# Patient Record
Sex: Female | Born: 1945 | Race: White | Hispanic: No | Marital: Married | Smoking: Former smoker
Health system: Southern US, Community
[De-identification: ages and names within clinical notes are randomized; demographics above are authoritative.]

## PROBLEM LIST (undated history)

## (undated) DIAGNOSIS — R7301 Impaired fasting glucose: Secondary | ICD-10-CM

## (undated) DIAGNOSIS — E785 Hyperlipidemia, unspecified: Secondary | ICD-10-CM

## (undated) DIAGNOSIS — I10 Essential (primary) hypertension: Secondary | ICD-10-CM

## (undated) DIAGNOSIS — M8580 Other specified disorders of bone density and structure, unspecified site: Secondary | ICD-10-CM

## (undated) DIAGNOSIS — D7589 Other specified diseases of blood and blood-forming organs: Secondary | ICD-10-CM

## (undated) HISTORY — DX: Other specified disorders of bone density and structure, unspecified site: M85.80

## (undated) HISTORY — DX: Essential (primary) hypertension: I10

## (undated) HISTORY — PX: APPENDECTOMY: SHX54

## (undated) HISTORY — DX: Impaired fasting glucose: R73.01

## (undated) HISTORY — PX: BREAST BIOPSY: SHX20

## (undated) HISTORY — DX: Other specified diseases of blood and blood-forming organs: D75.89

## (undated) HISTORY — DX: Hyperlipidemia, unspecified: E78.5

---
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
## 1975-10-08 HISTORY — PX: APPENDECTOMY: SHX54

## 1998-10-07 HISTORY — PX: BUNIONECTOMY: SHX129

## 2012-10-07 HISTORY — PX: COLONOSCOPY: SHX174

## 2014-03-07 HISTORY — PX: BREAST BIOPSY: SHX20

## 2015-04-12 LAB — HM DEXA SCAN

## 2015-12-20 DIAGNOSIS — I10 Essential (primary) hypertension: Secondary | ICD-10-CM | POA: Diagnosis not present

## 2016-01-17 DIAGNOSIS — H2513 Age-related nuclear cataract, bilateral: Secondary | ICD-10-CM | POA: Diagnosis not present

## 2016-02-08 DIAGNOSIS — I10 Essential (primary) hypertension: Secondary | ICD-10-CM | POA: Diagnosis not present

## 2016-02-08 DIAGNOSIS — Z1389 Encounter for screening for other disorder: Secondary | ICD-10-CM | POA: Diagnosis not present

## 2016-02-08 DIAGNOSIS — R7301 Impaired fasting glucose: Secondary | ICD-10-CM | POA: Diagnosis not present

## 2016-02-08 DIAGNOSIS — Z0001 Encounter for general adult medical examination with abnormal findings: Secondary | ICD-10-CM | POA: Diagnosis not present

## 2016-02-26 DIAGNOSIS — L562 Photocontact dermatitis [berloque dermatitis]: Secondary | ICD-10-CM | POA: Diagnosis not present

## 2016-03-01 DIAGNOSIS — Z1231 Encounter for screening mammogram for malignant neoplasm of breast: Secondary | ICD-10-CM | POA: Diagnosis not present

## 2016-03-01 DIAGNOSIS — Z9889 Other specified postprocedural states: Secondary | ICD-10-CM | POA: Diagnosis not present

## 2016-03-01 DIAGNOSIS — R921 Mammographic calcification found on diagnostic imaging of breast: Secondary | ICD-10-CM | POA: Diagnosis not present

## 2016-04-02 DIAGNOSIS — E785 Hyperlipidemia, unspecified: Secondary | ICD-10-CM | POA: Diagnosis not present

## 2016-04-02 DIAGNOSIS — L568 Other specified acute skin changes due to ultraviolet radiation: Secondary | ICD-10-CM | POA: Diagnosis not present

## 2016-04-02 DIAGNOSIS — R7301 Impaired fasting glucose: Secondary | ICD-10-CM | POA: Diagnosis not present

## 2016-04-02 DIAGNOSIS — Z1211 Encounter for screening for malignant neoplasm of colon: Secondary | ICD-10-CM | POA: Diagnosis not present

## 2016-04-02 DIAGNOSIS — D7589 Other specified diseases of blood and blood-forming organs: Secondary | ICD-10-CM

## 2016-04-02 DIAGNOSIS — Z1231 Encounter for screening mammogram for malignant neoplasm of breast: Secondary | ICD-10-CM | POA: Diagnosis not present

## 2016-04-02 DIAGNOSIS — I10 Essential (primary) hypertension: Secondary | ICD-10-CM | POA: Diagnosis not present

## 2016-04-02 DIAGNOSIS — M8580 Other specified disorders of bone density and structure, unspecified site: Secondary | ICD-10-CM

## 2016-04-02 DIAGNOSIS — Z23 Encounter for immunization: Secondary | ICD-10-CM | POA: Diagnosis not present

## 2016-04-02 HISTORY — DX: Other specified disorders of bone density and structure, unspecified site: M85.80

## 2016-04-02 HISTORY — DX: Other specified diseases of blood and blood-forming organs: D75.89

## 2016-05-09 DIAGNOSIS — R21 Rash and other nonspecific skin eruption: Secondary | ICD-10-CM | POA: Diagnosis not present

## 2016-06-26 DIAGNOSIS — L508 Other urticaria: Secondary | ICD-10-CM | POA: Diagnosis not present

## 2016-09-04 DIAGNOSIS — R109 Unspecified abdominal pain: Secondary | ICD-10-CM | POA: Diagnosis not present

## 2016-10-01 DIAGNOSIS — I10 Essential (primary) hypertension: Secondary | ICD-10-CM | POA: Diagnosis not present

## 2016-10-01 DIAGNOSIS — E785 Hyperlipidemia, unspecified: Secondary | ICD-10-CM | POA: Diagnosis not present

## 2016-10-01 DIAGNOSIS — R7301 Impaired fasting glucose: Secondary | ICD-10-CM | POA: Diagnosis not present

## 2016-10-01 DIAGNOSIS — D7589 Other specified diseases of blood and blood-forming organs: Secondary | ICD-10-CM | POA: Diagnosis not present

## 2016-10-08 DIAGNOSIS — R7301 Impaired fasting glucose: Secondary | ICD-10-CM | POA: Diagnosis not present

## 2016-10-08 DIAGNOSIS — D7589 Other specified diseases of blood and blood-forming organs: Secondary | ICD-10-CM | POA: Diagnosis not present

## 2016-10-08 DIAGNOSIS — I10 Essential (primary) hypertension: Secondary | ICD-10-CM | POA: Diagnosis not present

## 2016-10-08 DIAGNOSIS — E785 Hyperlipidemia, unspecified: Secondary | ICD-10-CM | POA: Diagnosis not present

## 2016-10-08 DIAGNOSIS — M8580 Other specified disorders of bone density and structure, unspecified site: Secondary | ICD-10-CM | POA: Diagnosis not present

## 2016-10-21 DIAGNOSIS — H2513 Age-related nuclear cataract, bilateral: Secondary | ICD-10-CM | POA: Diagnosis not present

## 2016-10-21 DIAGNOSIS — H524 Presbyopia: Secondary | ICD-10-CM | POA: Diagnosis not present

## 2017-01-15 DIAGNOSIS — L508 Other urticaria: Secondary | ICD-10-CM | POA: Diagnosis not present

## 2017-01-15 DIAGNOSIS — L810 Postinflammatory hyperpigmentation: Secondary | ICD-10-CM | POA: Diagnosis not present

## 2017-02-10 DIAGNOSIS — D690 Allergic purpura: Secondary | ICD-10-CM | POA: Diagnosis not present

## 2017-02-10 DIAGNOSIS — L308 Other specified dermatitis: Secondary | ICD-10-CM | POA: Diagnosis not present

## 2017-04-01 DIAGNOSIS — L820 Inflamed seborrheic keratosis: Secondary | ICD-10-CM | POA: Diagnosis not present

## 2017-04-03 DIAGNOSIS — Z1231 Encounter for screening mammogram for malignant neoplasm of breast: Secondary | ICD-10-CM | POA: Diagnosis not present

## 2017-04-03 LAB — HM MAMMOGRAPHY: HM Mammogram: NORMAL (ref 0–4)

## 2017-04-15 DIAGNOSIS — D7589 Other specified diseases of blood and blood-forming organs: Secondary | ICD-10-CM | POA: Diagnosis not present

## 2017-04-15 DIAGNOSIS — E785 Hyperlipidemia, unspecified: Secondary | ICD-10-CM | POA: Diagnosis not present

## 2017-04-15 DIAGNOSIS — R7301 Impaired fasting glucose: Secondary | ICD-10-CM | POA: Diagnosis not present

## 2017-04-15 DIAGNOSIS — I10 Essential (primary) hypertension: Secondary | ICD-10-CM | POA: Diagnosis not present

## 2017-04-15 LAB — CBC AND DIFFERENTIAL
HCT: 38 (ref 36–46)
HEMOGLOBIN: 12.5 (ref 12.0–16.0)
Platelets: 183 (ref 150–399)
WBC: 4

## 2017-04-15 LAB — HEPATIC FUNCTION PANEL
ALT: 22 (ref 7–35)
AST: 32 (ref 13–35)
Alkaline Phosphatase: 67 (ref 25–125)
BILIRUBIN, TOTAL: 0.3

## 2017-04-15 LAB — LIPID PANEL
CHOLESTEROL: 206 — AB (ref 0–200)
HDL: 83 — AB (ref 35–70)
LDL Cholesterol: 100
TRIGLYCERIDES: 116 (ref 40–160)

## 2017-04-15 LAB — BASIC METABOLIC PANEL
BUN: 11 (ref 4–21)
Creatinine: 0.6 (ref 0.5–1.1)
Glucose: 76
POTASSIUM: 4 (ref 3.4–5.3)
Sodium: 143 (ref 137–147)

## 2017-04-15 LAB — TSH: TSH: 4.9 (ref 0.41–5.90)

## 2017-04-22 DIAGNOSIS — D7589 Other specified diseases of blood and blood-forming organs: Secondary | ICD-10-CM | POA: Diagnosis not present

## 2017-04-22 DIAGNOSIS — I10 Essential (primary) hypertension: Secondary | ICD-10-CM | POA: Diagnosis not present

## 2017-04-22 DIAGNOSIS — M8580 Other specified disorders of bone density and structure, unspecified site: Secondary | ICD-10-CM | POA: Diagnosis not present

## 2017-04-22 DIAGNOSIS — R7301 Impaired fasting glucose: Secondary | ICD-10-CM | POA: Diagnosis not present

## 2017-04-22 DIAGNOSIS — E785 Hyperlipidemia, unspecified: Secondary | ICD-10-CM | POA: Diagnosis not present

## 2017-04-29 DIAGNOSIS — R921 Mammographic calcification found on diagnostic imaging of breast: Secondary | ICD-10-CM | POA: Diagnosis not present

## 2017-04-29 DIAGNOSIS — R928 Other abnormal and inconclusive findings on diagnostic imaging of breast: Secondary | ICD-10-CM | POA: Diagnosis not present

## 2017-06-04 DIAGNOSIS — L820 Inflamed seborrheic keratosis: Secondary | ICD-10-CM | POA: Diagnosis not present

## 2017-08-05 DIAGNOSIS — L209 Atopic dermatitis, unspecified: Secondary | ICD-10-CM | POA: Diagnosis not present

## 2017-08-05 DIAGNOSIS — E785 Hyperlipidemia, unspecified: Secondary | ICD-10-CM | POA: Diagnosis not present

## 2017-08-05 DIAGNOSIS — D692 Other nonthrombocytopenic purpura: Secondary | ICD-10-CM | POA: Diagnosis not present

## 2017-08-05 DIAGNOSIS — I10 Essential (primary) hypertension: Secondary | ICD-10-CM | POA: Diagnosis not present

## 2017-08-05 DIAGNOSIS — R109 Unspecified abdominal pain: Secondary | ICD-10-CM | POA: Diagnosis not present

## 2017-09-18 ENCOUNTER — Telehealth: Payer: Self-pay | Admitting: General Practice

## 2017-09-18 NOTE — Telephone Encounter (Signed)
Copied from [place] [phone]. Topic: Quick Communication - See Telephone Encounter >> Sep 18, 2017 10:52 AM Oneta Rack wrote: CRM for notification. See Telephone encounter for:   09/18/17.Relation to pt: spouse / Mr. Laurie Decker  Call back number: [phone]   Reason for call:  Patient would like to establish with Dr. Garret Reddish due to location being convenient, patient has AARP and Medicare. Patient would like spouse to establish preferable on the same day. Please advise if 10/24/16 at 3:30pm is appropriate.

## 2017-09-18 NOTE — Telephone Encounter (Signed)
Only accepting referrals from family/friends of current patient if medicare- if they are not in this group- can see one of our other excellent providers

## 2017-09-18 NOTE — Telephone Encounter (Signed)
Dr. Yong Channel,  Please advise if you approve of this new patient appointment.

## 2017-09-19 NOTE — Telephone Encounter (Signed)
Informed patient of message below and patient stated [employer] members referred MD, please advise

## 2017-09-26 NOTE — Telephone Encounter (Signed)
Can see one of our other clinician's since not clearly close friends/family of one of my existing patients.

## 2017-09-26 NOTE — Telephone Encounter (Signed)
Please advise 

## 2017-10-17 ENCOUNTER — Encounter: Payer: Self-pay | Admitting: Internal Medicine

## 2017-10-17 ENCOUNTER — Telehealth: Payer: Self-pay

## 2017-10-17 ENCOUNTER — Ambulatory Visit (INDEPENDENT_AMBULATORY_CARE_PROVIDER_SITE_OTHER): Payer: Medicare Other | Admitting: Internal Medicine

## 2017-10-17 VITALS — BP 122/70 | HR 78 | Temp 97.8°F | Resp 18 | Ht 63.0 in | Wt 143.8 lb

## 2017-10-17 DIAGNOSIS — M7918 Myalgia, other site: Secondary | ICD-10-CM | POA: Diagnosis not present

## 2017-10-17 DIAGNOSIS — I10 Essential (primary) hypertension: Secondary | ICD-10-CM | POA: Diagnosis not present

## 2017-10-17 DIAGNOSIS — R921 Mammographic calcification found on diagnostic imaging of breast: Secondary | ICD-10-CM | POA: Diagnosis not present

## 2017-10-17 DIAGNOSIS — R21 Rash and other nonspecific skin eruption: Secondary | ICD-10-CM

## 2017-10-17 NOTE — Telephone Encounter (Signed)
Patient was seen in the office today as a new patient for Dr. Eulas Post. Patient filled out the medical release paper but did not fill out who her previous pcp was. I tried to get in contact with the patient but her line was busy so I was unable to leave the patient a message to give me a call back. Will try to contact again before I leave for the day.

## 2017-10-17 NOTE — Progress Notes (Signed)
Patient ID: Laurie Decker, female   DOB: 10-25-1945, 72 y.o.   MRN: [id number]   Location:  [facility] OFFICE  Provider: DR Arletha Grippe  Code Status: FULL CODE Goals of Care: No flowsheet data found.   Chief Complaint  Patient presents with  . New Patient (Initial Visit)    New Patient to Establish with Dr. [facility]    HPI: Patient is a 72 y.o. female seen today as a new pt. Previous PCP in [place]. She is unsure of last CPE. 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 She states she had small pox vaccine x 13 and never developed immunity.  HTN - stable on lisinopril. ECG done in Nov 2018.  Right flank pain - intermittent. Stable on prn aleve. She was told it was due to "musculoskeletal" problem. She reports right rib fx prior to onset of pain.  Rash - nonspecific. Began while living in [place], [place] in 2012. She has seen x3 dermatologists and several tests have been run, including biopsy. No formal dx and w/u neg. Burning sensation and occurs on UE, ACW, and occasionally face. She has been tx with prn prednisone.   She takes several OTC supplements   Past Medical History:  Diagnosis Date  . High blood pressure     Past Surgical History:  Procedure Laterality Date  . APPENDECTOMY    . APPENDECTOMY  1977  . BUNIONECTOMY  2000     reports that she has been smoking.  She has been smoking about 3.00 packs per day. she has never used smokeless tobacco. She reports that she drinks alcohol. She reports that she does not use drugs. Social History   Socioeconomic History  . Marital status: Married    Spouse name: Not on file  . Number of children: Not on file  . Years of education: Not on file  . Highest education level: Not on file  Social Needs  . Financial resource strain: Not on file  . Food insecurity - worry: Not on file  . Food insecurity - inability: Not on file  . Transportation needs - medical: Not on file  . Transportation  needs - non-medical: Not on file  Occupational History  . Not on file  Tobacco Use  . Smoking status: Current Every Day Smoker    Packs/day: 3.00  . Smokeless tobacco: Never Used  Substance and Sexual Activity  . Alcohol use: Yes    Comment: 7 drinks per week   . Drug use: No  . Sexual activity: Not on file  Other Topics Concern  . Not on file  Social History Narrative   Diet: N/A      Caffeine: 1 cup of coffee daily      Married, if yes what year: 2014      Do you live in a house, apartment, assisted living, condo, trailer, ect: House, 1 stories, 2 persons       Pets: 1 dog       Current/Past profession: Nursing School       Exercise: Yes, daily         Living Will:  Yes   DNR: Yes   POA/HPOA: Yes       Functional Status:   Do you have difficulty bathing or dressing yourself? No   Do you have difficulty preparing food or eating? No   Do you have difficulty managing your medications? No   Do you have difficulty  managing your finances? No   Do you have difficulty affording your medications? No    Family History  Problem Relation Age of Onset  . Alcoholism Father   . Colon cancer Father     Allergies  Allergen Reactions  . Latex     Outpatient Encounter Medications as of 10/17/2017  Medication Sig  . Flaxseed, Linseed, (FLAXSEED OIL) 1000 MG CAPS Take by mouth daily.  Marland Kitchen lisinopril (PRINIVIL,ZESTRIL) 20 MG tablet Take 20 mg by mouth daily.  Marland Kitchen Lysine HCl 1000 MG TABS Take by mouth daily.  . Multiple Vitamins-Minerals (CENTRUM ADULTS PO) Take by mouth daily.  . naproxen sodium (ALEVE) 220 MG tablet Take 220 mg by mouth as needed.  . valACYclovir (VALTREX) 1000 MG tablet Take 1,000 mg by mouth as needed. Take 2 tablets   No facility-administered encounter medications on file as of 10/17/2017.     Review of Systems:  Review of Systems  Musculoskeletal: Positive for myalgias.  Hematological: Bruises/bleeds easily.  All other systems reviewed and are  negative.   Health Maintenance  Topic Date Due  . Hepatitis C Screening  10/13/1945  . MAMMOGRAM  09/23/1996  . COLONOSCOPY  09/23/1996  . DEXA SCAN  09/24/2011  . PNA vac Low Risk Adult (1 of 2 - PCV13) 09/24/2011  . TETANUS/TDAP  02/27/2014  . INFLUENZA VACCINE  05/07/2017    Physical Exam: Vitals:   10/17/17 1051  BP: 122/70  Pulse: 78  Resp: 18  Temp: 97.8 F (36.6 C)  TempSrc: Oral  SpO2: 97%  Weight: 143 lb 12.8 oz (65.2 kg)  Height: 5\' 3"  (1.6 m)   Body mass index is 25.47 kg/m. Physical Exam  Constitutional: She is oriented to person, place, and time. She appears well-developed and well-nourished.  HENT:  Mouth/Throat: Oropharynx is clear and moist. No oropharyngeal exudate.  MMM; no oral thrush  Eyes: Pupils are equal, round, and reactive to light. No scleral icterus.  Neck: Neck supple. Carotid bruit is not present. No tracheal deviation present. No thyromegaly present.  Cardiovascular: Normal rate, regular rhythm, normal heart sounds and intact distal pulses. Exam reveals no gallop and no friction rub.  No murmur heard. No LE edema b/l. no calf TTP.   Pulmonary/Chest: Effort normal and breath sounds normal. No stridor. No respiratory distress. She has no wheezes. She has no rales.  Abdominal: Soft. Normal appearance and bowel sounds are normal. She exhibits no distension and no mass. There is no hepatomegaly. There is no tenderness. There is no rigidity, no rebound and no guarding. No hernia.  Lymphadenopathy:    She has no cervical adenopathy.  Neurological: She is alert and oriented to person, place, and time.  Skin: Skin is warm and dry. No rash noted.  Psychiatric: She has a normal mood and affect. Her behavior is normal. Judgment and thought content normal.    Labs reviewed: Basic Metabolic Panel: No results for input(s): NA, K, CL, CO2, GLUCOSE, BUN, CREATININE, CALCIUM, MG, PHOS, TSH in the last 8760 hours. Liver Function Tests: No results for  input(s): AST, ALT, ALKPHOS, BILITOT, PROT, ALBUMIN in the last 8760 hours. No results for input(s): LIPASE, AMYLASE in the last 8760 hours. No results for input(s): AMMONIA in the last 8760 hours. CBC: No results for input(s): WBC, NEUTROABS, HGB, HCT, MCV, PLT in the last 8760 hours. Lipid Panel: No results for input(s): CHOL, HDL, LDLCALC, TRIG, CHOLHDL, LDLDIRECT in the last 8760 hours. No results found for: HGBA1C  Procedures since last  visit: No results found.  Assessment/Plan     ICD-10-CM   1. Essential hypertension I10   2. Musculoskeletal pain M79.18    right flank, intermittent  3. Rash and nonspecific skin eruption R21    nonspecific; burning sensation  4. Breast calcifications R92.1 MM Digital Diagnostic Unilat R   right; s/p biopsy in July 2018   Continue current medications as ordered  Get old records  Follow up in 3 mos for AWV/EV. Fasting labs prior to appt (CMP, lipid panel, tsh, cbc w diff, ua)   Sherryn Pollino S. Perlie Gold  [facility] [address] [place], [place] [address] [phone] Cell (Monday-Friday 8 AM - 5 PM) [phone] After 5 PM and follow prompts

## 2017-10-17 NOTE — Patient Instructions (Signed)
Continue current medications as ordered  Get old records  Follow up in 3 mos for AWV/EV. Fasting labs prior to appt

## 2017-10-22 ENCOUNTER — Telehealth: Payer: Self-pay

## 2017-10-22 NOTE — Telephone Encounter (Signed)
Patient called to give information on previous dermatologist.   Dr. Stacie Acres  [facility] [place] [address]  Phone: [phone]

## 2017-10-24 ENCOUNTER — Ambulatory Visit: Payer: Self-pay | Admitting: Family Medicine

## 2017-10-24 ENCOUNTER — Ambulatory Visit: Payer: Self-pay | Admitting: Sports Medicine

## 2018-01-01 ENCOUNTER — Other Ambulatory Visit: Payer: Self-pay | Admitting: Internal Medicine

## 2018-01-12 ENCOUNTER — Other Ambulatory Visit: Payer: Self-pay | Admitting: Internal Medicine

## 2018-01-12 ENCOUNTER — Ambulatory Visit
Admission: RE | Admit: 2018-01-12 | Discharge: 2018-01-12 | Disposition: A | Discharge status: Home / Self Care | Payer: Medicare Other | Source: Ambulatory Visit | Attending: Internal Medicine | Admitting: Internal Medicine

## 2018-01-12 DIAGNOSIS — R921 Mammographic calcification found on diagnostic imaging of breast: Secondary | ICD-10-CM

## 2018-01-13 NOTE — Addendum Note (Signed)
Addended by: Logan Bores on: 01/13/2018 04:52 PM   Modules accepted: Orders

## 2018-01-16 ENCOUNTER — Other Ambulatory Visit: Payer: Medicare Other

## 2018-01-16 ENCOUNTER — Ambulatory Visit (INDEPENDENT_AMBULATORY_CARE_PROVIDER_SITE_OTHER): Payer: Medicare Other

## 2018-01-16 VITALS — BP 120/70 | HR 85 | Temp 98.0°F | Ht 63.0 in | Wt 145.0 lb

## 2018-01-16 DIAGNOSIS — R921 Mammographic calcification found on diagnostic imaging of breast: Secondary | ICD-10-CM

## 2018-01-16 DIAGNOSIS — R21 Rash and other nonspecific skin eruption: Secondary | ICD-10-CM | POA: Diagnosis not present

## 2018-01-16 DIAGNOSIS — M7918 Myalgia, other site: Secondary | ICD-10-CM | POA: Diagnosis not present

## 2018-01-16 DIAGNOSIS — I10 Essential (primary) hypertension: Secondary | ICD-10-CM

## 2018-01-16 DIAGNOSIS — Z0000 Encounter for general adult medical examination without abnormal findings: Secondary | ICD-10-CM

## 2018-01-16 LAB — COMPLETE METABOLIC PANEL WITH GFR
AG Ratio: 1.9 (calc) (ref 1.0–2.5)
ALKALINE PHOSPHATASE (APISO): 65 U/L (ref 33–130)
ALT: 16 U/L (ref 6–29)
AST: 22 U/L (ref 10–35)
Albumin: 4.4 g/dL (ref 3.6–5.1)
BUN: 18 mg/dL (ref 7–25)
CO2: 31 mmol/L (ref 20–32)
CREATININE: 0.69 mg/dL (ref 0.60–0.93)
Calcium: 9.9 mg/dL (ref 8.6–10.4)
Chloride: 99 mmol/L (ref 98–110)
GFR, Est African American: 102 mL/min/{1.73_m2} (ref >=60)
GFR, Est Non African American: 88 mL/min/{1.73_m2} (ref >=60)
GLUCOSE: 83 mg/dL (ref 65–99)
Globulin: 2.3 g/dL (calc) (ref 1.9–3.7)
Potassium: 4.5 mmol/L (ref 3.5–5.3)
SODIUM: 138 mmol/L (ref 135–146)
Total Bilirubin: 0.6 mg/dL (ref 0.2–1.2)
Total Protein: 6.7 g/dL (ref 6.1–8.1)

## 2018-01-16 LAB — CBC WITH DIFFERENTIAL/PLATELET
BASOS PCT: 0.9 %
Basophils Absolute: 41 cells/uL (ref 0–200)
EOS ABS: 437 {cells}/uL (ref 15–500)
Eosinophils Relative: 9.5 %
HCT: 38.9 % (ref 35.0–45.0)
HEMOGLOBIN: 13.6 g/dL (ref 11.7–15.5)
Lymphs Abs: 1564 cells/uL (ref 850–3900)
MCH: 33.6 pg — AB (ref 27.0–33.0)
MCHC: 35 g/dL (ref 32.0–36.0)
MCV: 96 fL (ref 80.0–100.0)
MPV: 10.7 fL (ref 7.5–12.5)
Monocytes Relative: 11.9 %
Neutro Abs: 2010 cells/uL (ref 1500–7800)
Neutrophils Relative %: 43.7 %
PLATELETS: 175 10*3/uL (ref 140–400)
RBC: 4.05 10*6/uL (ref 3.80–5.10)
RDW: 12.2 % (ref 11.0–15.0)
TOTAL LYMPHOCYTE: 34 %
WBC: 4.6 10*3/uL (ref 3.8–10.8)
WBCMIX: 547 {cells}/uL (ref 200–950)

## 2018-01-16 LAB — LIPID PANEL
CHOLESTEROL: 228 mg/dL — AB (ref <200)
HDL: 76 mg/dL (ref >50)
LDL CHOLESTEROL (CALC): 127 mg/dL — AB
Non-HDL Cholesterol (Calc): 152 mg/dL (calc) — ABNORMAL HIGH (ref <130)
Total CHOL/HDL Ratio: 3 (calc) (ref <5.0)
Triglycerides: 136 mg/dL (ref <150)

## 2018-01-16 LAB — URINALYSIS, ROUTINE W REFLEX MICROSCOPIC
Bilirubin Urine: NEGATIVE
Glucose, UA: NEGATIVE
Hgb urine dipstick: NEGATIVE
KETONES UR: NEGATIVE
LEUKOCYTES UA: NEGATIVE
NITRITE: NEGATIVE
PROTEIN: NEGATIVE
Specific Gravity, Urine: 1.022 (ref 1.001–1.03)
pH: 6.5 (ref 5.0–8.0)

## 2018-01-16 LAB — TSH: TSH: 2.8 mIU/L (ref 0.40–4.50)

## 2018-01-16 NOTE — Patient Instructions (Signed)
[name] , Thank you for taking time to come for your Medicare Wellness Visit. I appreciate your ongoing commitment to your health goals. Please review the following plan we discussed and let me know if I can assist you in the future.   Screening recommendations/referrals: Colonoscopy up to date, due 03/11/2022 Mammogram up to date, due 03/27/2018 Bone Density up to date, due 11/18/2019 Recommended yearly ophthalmology/optometry visit for glaucoma screening and checkup Recommended yearly dental visit for hygiene and checkup  Vaccinations: Influenza vaccine due, declined Pneumococcal vaccine due, declined Tdap vaccine up to date, due 07/15/2018 Shingles vaccine due, declined    Advanced directives: Please bring [name] a copy to scan into your chart  Conditions/risks identified: none  Next appointment: Dr. Eulas Post 01/20/2018 @ 9:45am             Tyson Dense, RN 01/22/2019 @ 10am   Preventive Care 65 Years and Older, Female Preventive care refers to lifestyle choices and visits with your health care provider that can promote health and wellness. What does preventive care include?  A yearly physical exam. This is also called an annual well check.  Dental exams once or twice a year.  Routine eye exams. Ask your health care provider how often you should have your eyes checked.  Personal lifestyle choices, including:  Daily care of your teeth and gums.  Regular physical activity.  Eating a healthy diet.  Avoiding tobacco and drug use.  Limiting alcohol use.  Practicing safe sex.  Taking low-dose aspirin every day.  Taking vitamin and mineral supplements as recommended by your health care provider. What happens during an annual well check? The services and screenings done by your health care provider during your annual well check will depend on your age, overall health, lifestyle risk factors, and family history of disease. Counseling  Your health care provider may ask you  questions about your:  Alcohol use.  Tobacco use.  Drug use.  Emotional well-being.  Home and relationship well-being.  Sexual activity.  Eating habits.  History of falls.  Memory and ability to understand (cognition).  Work and work Statistician.  Reproductive health. Screening  You may have the following tests or measurements:  Height, weight, and BMI.  Blood pressure.  Lipid and cholesterol levels. These may be checked every 5 years, or more frequently if you are over 58 years old.  Skin check.  Lung cancer screening. You may have this screening every year starting at age 28 if you have a 30-pack-year history of smoking and currently smoke or have quit within the past 15 years.  Fecal occult blood test (FOBT) of the stool. You may have this test every year starting at age 40.  Flexible sigmoidoscopy or colonoscopy. You may have a sigmoidoscopy every 5 years or a colonoscopy every 10 years starting at age 20.  Hepatitis C blood test.  Hepatitis B blood test.  Sexually transmitted disease (STD) testing.  Diabetes screening. This is done by checking your blood sugar (glucose) after you have not eaten for a while (fasting). You may have this done every 1-3 years.  Bone density scan. This is done to screen for osteoporosis. You may have this done starting at age 56.  Mammogram. This may be done every 1-2 years. Talk to your health care provider about how often you should have regular mammograms. Talk with your health care provider about your test results, treatment options, and if necessary, the need for more tests. Vaccines  Your health care  provider may recommend certain vaccines, such as:  Influenza vaccine. This is recommended every year.  Tetanus, diphtheria, and acellular pertussis (Tdap, Td) vaccine. You may need a Td booster every 10 years.  Zoster vaccine. You may need this after age 57.  Pneumococcal 13-valent conjugate (PCV13) vaccine. One dose is  recommended after age 40.  Pneumococcal polysaccharide (PPSV23) vaccine. One dose is recommended after age 37. Talk to your health care provider about which screenings and vaccines you need and how often you need them. This information is not intended to replace advice given to you by your health care provider. Make sure you discuss any questions you have with your health care provider. Document Released: 10/20/2015 Document Revised: 06/12/2016 Document Reviewed: 07/25/2015 Elsevier Interactive Patient Education  2017 [place] Prevention in the Home Falls can cause injuries. They can happen to people of all ages. There are many things you can do to make your home safe and to help prevent falls. What can I do on the outside of my home?  Regularly fix the edges of walkways and driveways and fix any cracks.  Remove anything that might make you trip as you walk through a door, such as a raised step or threshold.  Trim any bushes or trees on the path to your home.  Use bright outdoor lighting.  Clear any walking paths of anything that might make someone trip, such as rocks or tools.  Regularly check to see if handrails are loose or broken. Make sure that both sides of any steps have handrails.  Any raised decks and porches should have guardrails on the edges.  Have any leaves, snow, or ice cleared regularly.  Use sand or salt on walking paths during winter.  Clean up any spills in your garage right away. This includes oil or grease spills. What can I do in the bathroom?  Use night lights.  Install grab bars by the toilet and in the tub and shower. Do not use towel bars as grab bars.  Use non-skid mats or decals in the tub or shower.  If you need to sit down in the shower, use a plastic, non-slip stool.  Keep the floor dry. Clean up any water that spills on the floor as soon as it happens.  Remove soap buildup in the tub or shower regularly.  Attach bath mats  securely with double-sided non-slip rug tape.  Do not have throw rugs and other things on the floor that can make you trip. What can I do in the bedroom?  Use night lights.  Make sure that you have a light by your bed that is easy to reach.  Do not use any sheets or blankets that are too big for your bed. They should not hang down onto the floor.  Have a firm chair that has side arms. You can use this for support while you get dressed.  Do not have throw rugs and other things on the floor that can make you trip. What can I do in the kitchen?  Clean up any spills right away.  Avoid walking on wet floors.  Keep items that you use a lot in easy-to-reach places.  If you need to reach something above you, use a strong step stool that has a grab bar.  Keep electrical cords out of the way.  Do not use floor polish or wax that makes floors slippery. If you must use wax, use non-skid floor wax.  Do not have throw  rugs and other things on the floor that can make you trip. What can I do with my stairs?  Do not leave any items on the stairs.  Make sure that there are handrails on both sides of the stairs and use them. Fix handrails that are broken or loose. Make sure that handrails are as long as the stairways.  Check any carpeting to make sure that it is firmly attached to the stairs. Fix any carpet that is loose or worn.  Avoid having throw rugs at the top or bottom of the stairs. If you do have throw rugs, attach them to the floor with carpet tape.  Make sure that you have a light switch at the top of the stairs and the bottom of the stairs. If you do not have them, ask someone to add them for you. What else can I do to help prevent falls?  Wear shoes that:  Do not have high heels.  Have rubber bottoms.  Are comfortable and fit you well.  Are closed at the toe. Do not wear sandals.  If you use a stepladder:  Make sure that it is fully opened. Do not climb a closed  stepladder.  Make sure that both sides of the stepladder are locked into place.  Ask someone to hold it for you, if possible.  Clearly mark and make sure that you can see:  Any grab bars or handrails.  First and last steps.  Where the edge of each step is.  Use tools that help you move around (mobility aids) if they are needed. These include:  Canes.  Walkers.  Scooters.  Crutches.  Turn on the lights when you go into a dark area. Replace any light bulbs as soon as they burn out.  Set up your furniture so you have a clear path. Avoid moving your furniture around.  If any of your floors are uneven, fix them.  If there are any pets around you, be aware of where they are.  Review your medicines with your doctor. Some medicines can make you feel dizzy. This can increase your chance of falling. Ask your doctor what other things that you can do to help prevent falls. This information is not intended to replace advice given to you by your health care provider. Make sure you discuss any questions you have with your health care provider. Document Released: 07/20/2009 Document Revised: 02/29/2016 Document Reviewed: 10/28/2014 Elsevier Interactive Patient Education  2017 [employer].

## 2018-01-16 NOTE — Progress Notes (Signed)
Subjective:   Laurie Decker is a 72 y.o. female who presents for Medicare Annual (Subsequent) preventive examination.  Last AWV-02/2016    Objective:     Vitals: BP 120/70 (BP Location: Left Arm, Patient Position: Sitting)   Pulse 85   Temp 98 F (36.7 C) (Oral)   Ht 5\' 3"  (1.6 m)   SpO2 97%   BMI 25.47 kg/m   Body mass index is 25.47 kg/m.  Advanced Directives 01/16/2018  Does Patient Have a Medical Advance Directive? Yes  Type of Paramedic of [place];Living will  Does patient want to make changes to medical advance directive? No - Patient declined  Copy of [place] in Chart? No - copy requested    Tobacco Social History   Tobacco Use  Smoking Status Former Smoker  . 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 High blood pressure   . Hyperlipidemia    Per previous Cardiology Record   . Impaired fasting glucose    Per previous Cardiology records   . Macrocytosis 04/02/2016   Per previous Cardiology records   . Osteopenia 04/02/2016   Per previous Cardiology records    Past Surgical History:  Procedure Laterality Date  . APPENDECTOMY    . APPENDECTOMY  1977  . BREAST BIOPSY  03/07/2014     Benign  . BUNIONECTOMY  2000  . COLONOSCOPY  10/07/2012   Per previous Cardiology records   . TONSILLECTOMY AND ADENOIDECTOMY  10/08/1955   Per previous Cardiology records    Family History  Problem Relation Age of Onset  . Heart disease Mother   . Alcoholism Father   . Colon cancer Father        malignant tumor of colon    Social History   Socioeconomic History  . Marital status: Married    Spouse name: Not on file  . Number of children: Not on file  . Years of education: Not on file  . Highest education level: Not on file  Occupational History  . Not on file  Social Needs  . Financial resource strain: Not hard at all  . Food insecurity:    Worry: Never true    Inability: Never true  . Transportation needs:    Medical: No    Non-medical: No  Tobacco Use  . Smoking status: Former Smoker    Packs/day: 3.00  . Smokeless tobacco: Never Used  . Tobacco comment: Quit in 45s  Substance and Sexual Activity  . Alcohol use: Yes    Alcohol/week: 1.8 oz    Types: 3 Standard drinks or equivalent per week    Comment: 3 drinks a day-vodka  . Drug use: No  . Sexual activity: Not on file  Lifestyle  . Physical activity:    Days per week: 0 days    Minutes per session: 0 min  . Stress: Only a little  Relationships  . Social connections:    Talks on phone: Twice a week    Gets together: Twice a week    Attends religious service: Never    Active member of club or organization: Yes    Attends meetings of clubs or organizations: More than 4 times per year    Relationship status: Married  Other Topics Concern  . Not on file  Social History Narrative   Diet: N/A      Caffeine: 1 cup of coffee daily      Married, if yes what year: 2014      Do you live in a house, apartment, assisted living, condo, trailer, ect: House, 1 stories, 2 persons       Pets: 1 dog       Current/Past profession: Nursing School       Exercise: Yes, daily         Living Will:  Yes   DNR:  Yes   POA/HPOA: Yes       Functional Status:   Do you have difficulty bathing or dressing yourself? No   Do you have difficulty preparing food or eating? No   Do you have difficulty managing your medications? No   Do you have difficulty managing your finances? No   Do you have difficulty affording your medications? No    Outpatient Encounter Medications as of 01/16/2018  Medication Sig  . Flaxseed, Linseed, (FLAXSEED OIL) 1000 MG CAPS Take by mouth daily.  Marland Kitchen lisinopril (PRINIVIL,ZESTRIL) 20 MG tablet TAKE 1 TABLET BY MOUTH DAILY  . Lysine HCl 1000 MG TABS Take by mouth daily.  . Multiple Vitamins-Minerals (CENTRUM ADULTS PO) Take by mouth daily.  . naproxen sodium (ALEVE) 220 MG tablet Take 220 mg by mouth as needed.  . Potassium Gluconate 550 MG TABS Take 550 mg by mouth.  . valACYclovir (VALTREX) 1000 MG tablet Take 1,000 mg by mouth as needed. Take 2 tablets   No facility-administered encounter medications on file as of 01/16/2018.     Activities of Daily Living In your present state of health, do you have any difficulty performing the following activities: 01/16/2018  Hearing? N  Vision? N  Difficulty concentrating or making decisions? N  Walking or climbing stairs? N  Dressing or bathing? N  Doing errands, shopping? N  Preparing Food and eating ? N  Using the Toilet? N  In the past six months, have you accidently leaked urine? N  Do you have problems with loss of bowel control? N  Managing your Medications? N  Managing your Finances? N  Housekeeping or managing your Housekeeping? N  Some recent data might be hidden    Patient Care Team: Gildardo Cranker, DO as PCP - General (Internal Medicine)    Assessment:   This is a routine wellness examination for [place].  Exercise Activities and Dietary recommendations Current Exercise Habits: The patient does not participate in regular exercise at present, Exercise limited by: None identified  Goals    . Patient Stated      Patient will start doing water aerobics       Fall Risk Fall Risk  01/16/2018 10/17/2017  Falls in the past year? Yes No  Number falls in past yr: 1 -  Injury with Fall? Yes -  Comment laceration of face -  Is the patient's home free of loose throw rugs in walkways, pet beds, electrical cords, etc?   yes      Grab bars in the bathroom? yes      Handrails on the stairs?   yes      Adequate lighting?   yes  Timed Get Up and Go performed: 18 seconds, fall risk  Depression Screen PHQ 2/9 Scores 01/16/2018 10/17/2017  PHQ - 2 Score 0 0     Cognitive Function MMSE - Mini Mental State Exam 01/16/2018  Orientation to time 5  Orientation to Place 5  Registration 3  Attention/ Calculation 5  Recall 2  Language- name 2 objects 2  Language- repeat 1  Language- follow 3 step command 3  Language- read & follow direction 1  Write a sentence 1  Copy design 1  Total score 29        Immunization History  Administered Date(s) Administered  . Hepatitis A 04/26/2004, 12/14/2004  . Hepatitis B, adult 10/07/2005  . IPV 07/15/2008  . Td 07/21/1976, 02/03/2004  . Tdap 07/15/2008  . Yellow Fever 02/03/2004    Qualifies for Shingles Vaccine? Yes, educated and declined  Screening Tests Health Maintenance  Topic Date Due  . Hepatitis C Screening  06-08-1946  . PNA vac Low Risk Adult (1 of 2 - PCV13) 09/24/2011  . INFLUENZA VACCINE  05/07/2018  . TETANUS/TDAP  07/15/2018  . MAMMOGRAM  04/04/2019  . COLONOSCOPY  03/11/2022  . DEXA SCAN  Completed    Cancer Screenings: Lung: Low Dose CT Chest recommended if Age 30-80 years, 30 pack-year currently smoking OR have quit w/in 15years. Patient does not qualify. Breast:  Up to date on Mammogram? Yes   Up to date of Bone Density/Dexa? Yes Colorectal: up to date  Additional Screenings: : Hepatitis C Screening: declined     Plan:    I have personally reviewed and addressed the Medicare Annual Wellness questionnaire and have noted the  following in the patient's chart:  A. Medical and social history B. Use of alcohol, tobacco or illicit drugs  C. Current medications and supplements D. Functional ability and status E.  Nutritional status F.  Physical activity G. Advance directives H. List of other physicians I.  Hospitalizations, surgeries, and ER visits in previous 12 months J.  Sh[place] to include hearing, vision, cognitive, depression L. Referrals and appointments - none  In addition, I have reviewed and discussed with patient certain preventive protocols, quality metrics, and best practice recommendations. A written personalized care plan for preventive services as well as general preventive health recommendations were provided to patient.  See attached scanned questionnaire for additional information.   Signed,   Tyson Dense, RN Nurse Health Advisor  Patient Concerns: Loose stools over last month, no pain, cramps or blood in stools

## 2018-01-20 ENCOUNTER — Encounter: Payer: Self-pay | Admitting: Internal Medicine

## 2018-01-20 ENCOUNTER — Ambulatory Visit (INDEPENDENT_AMBULATORY_CARE_PROVIDER_SITE_OTHER): Payer: Medicare Other | Admitting: Internal Medicine

## 2018-01-20 VITALS — BP 130/80 | HR 76 | Temp 98.1°F | Ht 63.0 in | Wt 146.0 lb

## 2018-01-20 DIAGNOSIS — I10 Essential (primary) hypertension: Secondary | ICD-10-CM | POA: Diagnosis not present

## 2018-01-20 DIAGNOSIS — E782 Mixed hyperlipidemia: Secondary | ICD-10-CM | POA: Diagnosis not present

## 2018-01-20 DIAGNOSIS — R921 Mammographic calcification found on diagnostic imaging of breast: Secondary | ICD-10-CM | POA: Diagnosis not present

## 2018-01-20 DIAGNOSIS — N6011 Diffuse cystic mastopathy of right breast: Secondary | ICD-10-CM | POA: Diagnosis not present

## 2018-01-20 DIAGNOSIS — N6012 Diffuse cystic mastopathy of left breast: Secondary | ICD-10-CM | POA: Diagnosis not present

## 2018-01-20 NOTE — Progress Notes (Signed)
Patient ID: Laurie Decker, female   DOB: 30-Apr-1946, 72 y.o.   MRN: [id number]   Location:  [facility]  Place of Service:  OFFICE  Provider: Arletha Grippe, DO  Patient Care Team: Gildardo Cranker, DO as PCP - General (Internal Medicine)  Extended Emergency Contact Information Primary Emergency Contact: [place], [place] Mobile Phone: [phone] Relation: Spouse  Code Status:  Goals of Care: Advanced Directive information Advanced Directives 01/16/2018  Does Patient Have a Medical Advance Directive? Yes  Type of Paramedic of [place];Living will  Does patient want to make changes to medical advance directive? No - Patient declined  Copy of [place] in Chart? No - copy requested     Chief Complaint  Patient presents with  . Annual Exam    CPE; MMSE 29/30, AWV (01/16/18) Neg Depression, Pos Fall Risk; Pt c/o of diarrhea for over month hasn't been taking an med for it. Per patient EKG completed 08/2017   . Medication Refill    No Refills  . Advance Care Planning    HCPOA/Living Will, will bring copy as instructed at AWV with Clarise Cruz     HPI: Patient is a 72 y.o. female seen in today for an extended visit.  She reports 1 month hx watery diarrhea ( 3 stools from 0600-1200 daily). No known cause. No abx use in last 6 mos. No travel hx outside [place]. No BRBPR/melena. Occasional abdominal cramping with gas/bloating but no localized pain. No f/c. No other concerns. Prior to onset of sx's she was on a keto diet. She completed AWV 01/16/18. 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 She states she had small pox vaccine x 13 and never developed immunity.  HTN - controlled on lisinopril. ECG done in Nov 2018.  Right flank pain - intermittent. Stable on prn aleve. She was told it was due to "musculoskeletal" problem. She reports right rib fx prior to onset of pain.  Rash - nonspecific. Began while living in [place], [place] in 2012. She has  seen x3 dermatologists and several tests have been run, including biopsy. No formal dx and w/u neg. Burning sensation and occurs on UE, ACW, and occasionally face. She has been tx with prn prednisone.   She takes several OTC supplements  Reviewed old record - she has a hx macrocytosis, osteopenia (last DXA in  Jul 2016), IFG (last a1c 4.9% in Jul 2018). Last LDL 100 in 2018   Depression screen [facility] 2/9 01/16/2018 10/17/2017  Decreased Interest 0 0  Down, Depressed, Hopeless 0 0  PHQ - 2 Score 0 0    Fall Risk  01/20/2018 01/20/2018 01/16/2018 10/17/2017  Falls in the past year? No No Yes No  Number falls in past yr: - - 1 -  Injury with Fall? - - Yes -  Comment - - laceration of face -   MMSE - Mini Mental State Exam 01/16/2018  Orientation to time 5  Orientation to Place 5  Registration 3  Attention/ Calculation 5  Recall 2  Language- name 2 objects 2  Language- repeat 1  Language- follow 3 step command 3  Language- read & follow direction 1  Write a sentence 1  Copy design 1  Total score 29     Health Maintenance  Topic Date Due  . Hepatitis C Screening  28-Apr-1946  . PNA vac Low Risk Adult (1 of 2 - PCV13) 09/24/2011  . INFLUENZA VACCINE  05/07/2018  .  TETANUS/TDAP  07/15/2018  . MAMMOGRAM  04/04/2019  . COLONOSCOPY  03/11/2022  . DEXA SCAN  Completed    Past Medical History:  Diagnosis Date  . High blood pressure   . Hyperlipidemia    Per previous Cardiology Record   . Impaired fasting glucose    Per previous Cardiology records   . Macrocytosis 04/02/2016   Per previous Cardiology records   . Osteopenia 04/02/2016   Per previous Cardiology records     Past Surgical History:  Procedure Laterality Date  . APPENDECTOMY    . APPENDECTOMY  1977  . BREAST BIOPSY  03/07/2014   Benign  . BUNIONECTOMY  2000  . COLONOSCOPY  10/07/2012   Per previous Cardiology records   . TONSILLECTOMY AND ADENOIDECTOMY  10/08/1955   Per previous Cardiology records     Family  History  Problem Relation Age of Onset  . Heart disease Mother   . Alcoholism Father   . Colon cancer Father        malignant tumor of colon    Family Status  Relation Name Status  . Mother Joaquim Lai Deceased  . Father Clance Boll Deceased  . Sister [employer]  . Brother [employer]  . Sister Robina Ade  . Brother [employer]  . Brother [facility]  . Son Vita Erm  . Son Tessa Lerner    Social History   Socioeconomic History  . Marital status: Married    Spouse name: Not on file  . Number of children: Not on file  . Years of education: Not on file  . Highest education level: Not on file  Occupational History  . Not on file  Social Needs  . Financial resource strain: Not hard at all  . Food insecurity:    Worry: Never true    Inability: Never true  . Transportation needs:    Medical: No    Non-medical: No  Tobacco Use  . Smoking status: Former Smoker    Packs/day: 3.00  . Smokeless tobacco: Never Used  . Tobacco comment: Quit in 83s  Substance and Sexual Activity  . Alcohol use: Yes    Alcohol/week: 1.8 oz    Types: 3 Standard drinks or equivalent per week    Comment: 3 drinks a day-vodka  . Drug use: No  . Sexual activity: Not on file  Lifestyle  . Physical activity:    Days per week: 0 days    Minutes per session: 0 min  . Stress: Only a little  Relationships  . Social connections:    Talks on phone: Twice a week    Gets together: Twice a week    Attends religious service: Never    Active member of club or organization: Yes    Attends meetings of clubs or organizations: More than 4 times per year    Relationship status: Married  . Intimate partner violence:    Fear of current or ex partner: No    Emotionally abused: No    Physically abused: No    Forced sexual activity: No  Other Topics Concern  . Not on file  Social History Narrative   Diet: N/A      Caffeine: 1 cup of coffee daily      Married, if yes what year: 2014      Do you live in a  house, apartment, assisted living, condo, trailer, ect: House, 1 stories, 2 persons       Pets: 1 dog  Current/Past profession: Nursing School       Exercise: Yes, daily         Living Will:  Yes   DNR: Yes   POA/HPOA: Yes       Functional Status:   Do you have difficulty bathing or dressing yourself? No   Do you have difficulty preparing food or eating? No   Do you have difficulty managing your medications? No   Do you have difficulty managing your finances? No   Do you have difficulty affording your medications? No    Allergies  Allergen Reactions  . Latex     Allergies as of 01/20/2018      Reactions   Latex       Medication List        Accurate as of 01/20/18 10:16 AM. Always use your most recent med list.          CENTRUM ADULTS PO Take by mouth daily.   Flaxseed Oil 1000 MG Caps Take by mouth daily.   lisinopril 20 MG tablet Commonly known as:  PRINIVIL,ZESTRIL TAKE 1 TABLET BY MOUTH DAILY   Lysine HCl 1000 MG Tabs Take by mouth daily.   naproxen sodium 220 MG tablet Commonly known as:  ALEVE Take 220 mg by mouth as needed.   Potassium Gluconate 550 MG Tabs Take 550 mg by mouth.   VALTREX 1000 MG tablet Generic drug:  valACYclovir Take 1,000 mg by mouth as needed. Take 2 tablets        Review of Systems:  Review of Systems  Gastrointestinal: Positive for abdominal pain and diarrhea.  Skin: Positive for rash.  All other systems reviewed and are negative.   Physical Exam: Vitals:   01/20/18 0957  BP: 130/80  Pulse: 76  Temp: 98.1 F (36.7 C)  TempSrc: Oral  SpO2: 97%  Weight: 146 lb (66.2 kg)  Height: '5\' 3"'$  (1.6 m)   Body mass index is 25.86 kg/m. Physical Exam  Constitutional: She is oriented to person, place, and time. She appears well-developed and well-nourished. No distress.  HENT:  Head: Normocephalic and atraumatic.  Right Ear: External ear normal.  Left Ear: External ear normal.  Mouth/Throat: Oropharynx is  clear and moist. No oropharyngeal exudate.  MMM; no oral thrush  Eyes: Pupils are equal, round, and reactive to light. EOM are normal. No scleral icterus.  Neck: Normal range of motion. Neck supple. Carotid bruit is not present. No tracheal deviation present. No thyromegaly present.  Cardiovascular: Normal rate, regular rhythm and intact distal pulses. Exam reveals no gallop and no friction rub.  No murmur heard. No LE edema b/l. No calf TTP  Pulmonary/Chest: Effort normal and breath sounds normal. No respiratory distress. She has no wheezes. She has no rhonchi. She has no rales. She exhibits no tenderness. Right breast exhibits no inverted nipple, no mass, no nipple discharge, no skin change and no tenderness. Left breast exhibits tenderness. Left breast exhibits no inverted nipple, no mass, no nipple discharge and no skin change. Breasts are symmetrical.    No rhonchi  Abdominal: Soft. Bowel sounds are normal. She exhibits no distension and no mass. There is no hepatosplenomegaly or hepatomegaly. There is no tenderness. There is no rebound and no guarding. No hernia.  Musculoskeletal: She exhibits no deformity.  Lymphadenopathy:    She has no cervical adenopathy.  Neurological: She is alert and oriented to person, place, and time. She has normal reflexes.  Skin: Skin is warm and dry. No rash  noted.  Psychiatric: She has a normal mood and affect. Her behavior is normal. Judgment normal.  Vitals reviewed.   Labs reviewed:  Basic Metabolic Panel: Recent Labs    04/15/17 01/16/18 0806  NA 143 138  K 4.0 4.5  CL  --  99  CO2  --  31  GLUCOSE  --  83  BUN 11 18  CREATININE 0.6 0.69  CALCIUM  --  9.9  TSH 4.90 2.80   Liver Function Tests: Recent Labs    04/15/17 01/16/18 0806  AST 32 22  ALT 22 16  ALKPHOS 67  --   BILITOT  --  0.6  PROT  --  6.7   No results for input(s): LIPASE, AMYLASE in the last 8760 hours. No results for input(s): AMMONIA in the last 8760  hours. CBC: Recent Labs    04/15/17 01/16/18 0806  WBC 4.0 4.6  NEUTROABS  --  2,010  HGB 12.5 13.6  HCT 38 38.9  MCV  --  96.0  PLT 183 175   Lipid Panel: Recent Labs    04/15/17 01/16/18 0806  CHOL 206* 228*  HDL 83* 76  LDLCALC 100 127*  TRIG 116 136  CHOLHDL  --  3.0   No results found for: HGBA1C  Procedures: Mm Diag Breast Tomo Uni Right  Result Date: 01/12/2018 CLINICAL DATA:  72 year old patient recently moved here from [place]. She presents for delayed six-month follow-up of probably benign cluster of calcifications in upper inner quadrant of the right breast.Her prior mammogram images and reports are available for comparison. EXAM: DIGITAL DIAGNOSTIC UNILATERAL RIGHT MAMMOGRAM WITH CAD AND TOMO COMPARISON:  April 29, 2017, April 03, 2017, Mar 01, 2016, Feb 15, 2015, August 08, 2014, January 26, 2014 ACR Breast Density Category b: There are scattered areas of fibroglandular density. FINDINGS: A tight group of calcifications in the upper inner quadrant of the right breast are mammographically stable compared to the diagnostic mammogram with magnification views performed April 29, 2017 from [facility] for [facility] in [place], [place]. These calcifications span approximately 6.5 mm greatest diameter. In the outer right breast are chronic scattered punctate calcifications. There is coil shaped biopsy clip in the far upper outer right breast. Vascular calcifications are noted. No suspicious mass or architectural distortion. No suspicious microcalcifications. Mammographic images were processed with CAD. IMPRESSION: Stable probably benign calcifications in the upper inner quadrant of the right breast. No new or suspicious findings. RECOMMENDATION: Bilateral diagnostic mammogram is recommended in June 2019 to complete a 1 year follow-up. I have discussed the findings and recommendations with the patient. Results were also provided in writing at the conclusion of the visit. If  applicable, a reminder letter will be sent to the patient regarding the next appointment. BI-RADS CATEGORY  3: Probably benign. Electronically Signed   By: Curlene Dolphin M.D.   On: 01/12/2018 10:03    Assessment/Plan   ICD-10-CM   1. Mixed hyperlipidemia E78.2 Lipid Panel  2. Essential hypertension I10 BMP with eGFR([facility])  3. Breast calcifications R92.1   4. Fibrocystic breast changes, bilateral N60.11    N60.12     Diarrhea sx's only in the AM - continue observation for now  Increase exercise as tolerated  Continue making healthy food choices  Continue current medications as ordered  Follow up in 6 mos for HTN and hyperlipidemia. Fasting lab prior to appt   [facility] [place], [place] [phone] Cell (Monday-Friday 8 AM - 5 PM) [phone] After 5 PM and follow prompts

## 2018-01-20 NOTE — Patient Instructions (Addendum)
Increase exercise as tolerated  Continue making healthy food choices  Continue current medications as ordered  Follow up in 6 mos for HTN and hyperlipidemia. Fasting lab prior to appt

## 2018-01-21 ENCOUNTER — Encounter: Payer: Self-pay | Admitting: Internal Medicine

## 2018-01-21 DIAGNOSIS — E782 Mixed hyperlipidemia: Secondary | ICD-10-CM | POA: Insufficient documentation

## 2018-01-21 DIAGNOSIS — N6012 Diffuse cystic mastopathy of left breast: Secondary | ICD-10-CM

## 2018-01-21 DIAGNOSIS — N6011 Diffuse cystic mastopathy of right breast: Secondary | ICD-10-CM | POA: Insufficient documentation

## 2018-03-27 ENCOUNTER — Ambulatory Visit
Admission: RE | Admit: 2018-03-27 | Discharge: 2018-03-27 | Disposition: A | Discharge status: Home / Self Care | Payer: Medicare Other | Source: Ambulatory Visit | Attending: Internal Medicine | Admitting: Internal Medicine

## 2018-03-27 DIAGNOSIS — R921 Mammographic calcification found on diagnostic imaging of breast: Secondary | ICD-10-CM

## 2018-04-01 ENCOUNTER — Other Ambulatory Visit: Payer: Self-pay | Admitting: *Deleted

## 2018-04-01 MED ORDER — LISINOPRIL 20 MG PO TABS: 20.0000 mg | ORAL_TABLET | Freq: Every day | ORAL | 3 refills | Status: DC

## 2018-05-27 ENCOUNTER — Encounter: Payer: Self-pay | Admitting: Internal Medicine

## 2018-07-06 DIAGNOSIS — L509 Urticaria, unspecified: Secondary | ICD-10-CM | POA: Diagnosis not present

## 2018-07-06 DIAGNOSIS — D229 Melanocytic nevi, unspecified: Secondary | ICD-10-CM | POA: Diagnosis not present

## 2018-07-17 ENCOUNTER — Other Ambulatory Visit: Payer: Medicare Other

## 2018-07-17 DIAGNOSIS — I10 Essential (primary) hypertension: Secondary | ICD-10-CM

## 2018-07-17 DIAGNOSIS — E782 Mixed hyperlipidemia: Secondary | ICD-10-CM | POA: Diagnosis not present

## 2018-07-17 LAB — BASIC METABOLIC PANEL WITH GFR
BUN: 17 mg/dL (ref 7–25)
CALCIUM: 9.8 mg/dL (ref 8.6–10.4)
CHLORIDE: 99 mmol/L (ref 98–110)
CO2: 29 mmol/L (ref 20–32)
Creat: 0.74 mg/dL (ref 0.60–0.93)
GFR, EST NON AFRICAN AMERICAN: 82 mL/min/{1.73_m2} (ref >=60)
GFR, Est African American: 94 mL/min/{1.73_m2} (ref >=60)
GLUCOSE: 85 mg/dL (ref 65–99)
POTASSIUM: 4.3 mmol/L (ref 3.5–5.3)
SODIUM: 138 mmol/L (ref 135–146)

## 2018-07-17 LAB — LIPID PANEL
Cholesterol: 226 mg/dL — ABNORMAL HIGH (ref <200)
HDL: 79 mg/dL (ref >50)
LDL Cholesterol (Calc): 125 mg/dL (calc) — ABNORMAL HIGH
NON-HDL CHOLESTEROL (CALC): 147 mg/dL — AB (ref <130)
TRIGLYCERIDES: 109 mg/dL (ref <150)
Total CHOL/HDL Ratio: 2.9 (calc) (ref <5.0)

## 2018-07-22 ENCOUNTER — Ambulatory Visit (INDEPENDENT_AMBULATORY_CARE_PROVIDER_SITE_OTHER): Payer: Medicare Other | Admitting: Internal Medicine

## 2018-07-22 ENCOUNTER — Encounter: Payer: Self-pay | Admitting: Internal Medicine

## 2018-07-22 VITALS — BP 126/88 | HR 84 | Temp 98.0°F | Ht 63.0 in | Wt 146.0 lb

## 2018-07-22 DIAGNOSIS — I10 Essential (primary) hypertension: Secondary | ICD-10-CM

## 2018-07-22 DIAGNOSIS — E782 Mixed hyperlipidemia: Secondary | ICD-10-CM

## 2018-07-22 DIAGNOSIS — Z8619 Personal history of other infectious and parasitic diseases: Secondary | ICD-10-CM | POA: Diagnosis not present

## 2018-07-22 MED ORDER — VALACYCLOVIR HCL 1 G PO TABS: 2000.0000 mg | ORAL_TABLET | ORAL | 2 refills | Status: DC | PRN

## 2018-07-22 MED ORDER — TETANUS-DIPHTH-ACELL PERTUSSIS 5-2.5-18.5 LF-MCG/0.5 IM SUSP: 0.5000 mL | Freq: Once | INTRAMUSCULAR | 0 refills | Status: AC

## 2018-07-22 NOTE — Progress Notes (Signed)
Patient ID: Laurie Decker, female   DOB: [birth year], 72 y.o.   MRN: [id number]   Location:  [facility] OFFICE  Provider: DR Arletha Grippe  Code Status:  Goals of Care:  Advanced Directives 01/16/2018  Does Patient Have a Medical Advance Directive? Yes  Type of Paramedic of [place];Living will  Does patient want to make changes to medical advance directive? No - Patient declined  Copy of [place] in Chart? No - copy requested     Chief Complaint  Patient presents with  . Medical Management of Chronic Issues    6 month follow-up and disucss labs (copy printed)     HPI: Patient is a 72 y.o. female seen today for medical management of chronic diseases.  She has no concerns. She needs RF on valtrex. Appetite ok and sleeps well.   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 She rarely has an outbreak. She states she had small pox vaccine x 13 and never developed immunity.  HTN - controlled on lisinopril. ECG done in Nov 2018.  Right flank pain - intermittent. Stable on prn aleve. She was told it was due to "musculoskeletal" problem. She reports right rib fx prior to onset of pain.  Rash - nonspecific. Began while living in [place], [place] in 2012. She has seen x3 dermatologists and several tests have been run, including biopsy. No formal dx and w/u neg. Burning sensation and occurs on UE, ACW, and occasionally face. She has been tx with prn prednisone.   She takes several OTC supplements  Past Medical History:  Diagnosis Date  . High blood pressure   . Hyperlipidemia    Per previous Cardiology Record   . Impaired fasting glucose    Per previous Cardiology records   . Macrocytosis 04/02/2016   Per previous Cardiology records   . Osteopenia 04/02/2016   Per previous Cardiology records     Past Surgical History:  Procedure Laterality Date  . APPENDECTOMY    . APPENDECTOMY  1977  . BREAST BIOPSY  03/07/2014   Benign  . BUNIONECTOMY   2000  . COLONOSCOPY  10/07/2012   Per previous Cardiology records   . TONSILLECTOMY AND ADENOIDECTOMY  10/08/1955   Per previous Cardiology records      reports that she has quit smoking. She smoked 3.00 packs per day. She has never used smokeless tobacco. She reports that she drinks about 3.0 standard drinks of alcohol per week. She reports that she does not use drugs. Social History   Socioeconomic History  . Marital status: Married    Spouse name: Not on file  . Number of children: Not on file  . Years of education: Not on file  . Highest education level: Not on file  Occupational History  . Not on file  Social Needs  . Financial resource strain: Not hard at all  . Food insecurity:    Worry: Never true    Inability: Never true  . Transportation needs:    Medical: No    Non-medical: No  Tobacco Use  . Smoking status: Former Smoker    Packs/day: 3.00  . Smokeless tobacco: Never Used  . Tobacco comment: Quit in 71s  Substance and Sexual Activity  . Alcohol use: Yes    Alcohol/week: 3.0 standard drinks    Types: 3 Standard drinks or equivalent per week    Comment: 3 drinks a day-vodka  . Drug use: No  . Sexual activity: Not  on file  Lifestyle  . Physical activity:    Days per week: 0 days    Minutes per session: 0 min  . Stress: Only a little  Relationships  . Social connections:    Talks on phone: Twice a week    Gets together: Twice a week    Attends religious service: Never    Active member of club or organization: Yes    Attends meetings of clubs or organizations: More than 4 times per year    Relationship status: Married  . Intimate partner violence:    Fear of current or ex partner: No    Emotionally abused: No    Physically abused: No    Forced sexual activity: No  Other Topics Concern  . Not on file  Social History Narrative   Diet: N/A      Caffeine: 1 cup of coffee daily      Married, if yes what year: 2014      Do you live in a house,  apartment, assisted living, condo, trailer, ect: House, 1 stories, 2 persons       Pets: 1 dog       Current/Past profession: Nursing School       Exercise: Yes, daily         Living Will:  Yes   DNR: Yes   POA/HPOA: Yes       Functional Status:   Do you have difficulty bathing or dressing yourself? No   Do you have difficulty preparing food or eating? No   Do you have difficulty managing your medications? No   Do you have difficulty managing your finances? No   Do you have difficulty affording your medications? No    Family History  Problem Relation Age of Onset  . Heart disease Mother   . Alcoholism Father   . Colon cancer Father        malignant tumor of colon     Allergies  Allergen Reactions  . Latex     Outpatient Encounter Medications as of 07/22/2018  Medication Sig  . Flaxseed, Linseed, (FLAXSEED OIL) 1000 MG CAPS Take by mouth daily.  Marland Kitchen lisinopril (PRINIVIL,ZESTRIL) 20 MG tablet Take 1 tablet (20 mg total) by mouth daily.  Marland Kitchen Lysine HCl 1000 MG TABS Take by mouth daily.  . Multiple Vitamins-Minerals (CENTRUM ADULTS PO) Take by mouth daily.  . naproxen sodium (ALEVE) 220 MG tablet Take 220 mg by mouth as needed.  . Potassium Gluconate 550 MG TABS Take 550 mg by mouth.  . Tdap (BOOSTRIX) 5-2.5-18.5 LF-MCG/0.5 injection Inject 0.5 mLs into the muscle once for 1 dose.  . valACYclovir (VALTREX) 1000 MG tablet Take 1,000 mg by mouth as needed. Take 2 tablets  . [DISCONTINUED] Tdap (BOOSTRIX) 5-2.5-18.5 LF-MCG/0.5 injection Inject 0.5 mLs into the muscle once.   No facility-administered encounter medications on file as of 07/22/2018.     Review of Systems:  Review of Systems  Musculoskeletal: Positive for arthralgias.  Skin: Positive for rash.  All other systems reviewed and are negative.   Health Maintenance  Topic Date Due  . Hepatitis C Screening  Sep 08, 1946  . TETANUS/TDAP  07/15/2018  . PNA vac Low Risk Adult (1 of 2 - PCV13) 07/23/2019 (Originally  09/24/2011)  . MAMMOGRAM  03/27/2020  . COLONOSCOPY  03/11/2022  . DEXA SCAN  Completed  . INFLUENZA VACCINE  Discontinued    Physical Exam: Vitals:   07/22/18 0909  BP: 126/88  Pulse: 84  Temp:  37 F (36.7 C)  TempSrc: Oral  SpO2: 98%  Weight: 146 lb (66.2 kg)  Height: 5\' 3"  (1.6 m)   Body mass index is 25.86 kg/m. Physical Exam  Constitutional: She is oriented to person, place, and time. She appears well-developed and well-nourished.  HENT:  Mouth/Throat: Oropharynx is clear and moist. No oropharyngeal exudate.  MMM; no oral thrush  Eyes: Pupils are equal, round, and reactive to light. No scleral icterus.  Neck: Neck supple. Carotid bruit is not present. No tracheal deviation present. No thyromegaly present.  Cardiovascular: Normal rate, regular rhythm, normal heart sounds and intact distal pulses. Exam reveals no gallop and no friction rub.  No murmur heard. No LE edema b/l. no calf TTP.   Pulmonary/Chest: Effort normal and breath sounds normal. No stridor. No respiratory distress. She has no wheezes. She has no rales.  Abdominal: Soft. Normal appearance and bowel sounds are normal. She exhibits no distension and no mass. There is no hepatomegaly. There is no tenderness. There is no rigidity, no rebound and no guarding. No hernia.  Lymphadenopathy:    She has no cervical adenopathy.  Neurological: She is alert and oriented to person, place, and time. She has normal reflexes.  Skin: Skin is warm and dry. No rash noted.  Psychiatric: She has a normal mood and affect. Her behavior is normal. Judgment and thought content normal.    Labs reviewed: Basic Metabolic Panel: Recent Labs    01/16/18 0806 07/17/18 0829  NA 138 138  K 4.5 4.3  CL 99 99  CO2 31 29  GLUCOSE 83 85  BUN 18 17  CREATININE 0.69 0.74  CALCIUM 9.9 9.8  TSH 2.80  --    Liver Function Tests: Recent Labs    01/16/18 0806  AST 22  ALT 16  BILITOT 0.6  PROT 6.7   No results for input(s):  LIPASE, AMYLASE in the last 8760 hours. No results for input(s): AMMONIA in the last 8760 hours. CBC: Recent Labs    01/16/18 0806  WBC 4.6  NEUTROABS 2,010  HGB 13.6  HCT 38.9  MCV 96.0  PLT 175   Lipid Panel: Recent Labs    01/16/18 0806 07/17/18 0829  CHOL 228* 226*  HDL 76 79  LDLCALC 127* 125*  TRIG 136 109  CHOLHDL 3.0 2.9   No results found for: HGBA1C  Procedures since last visit: No results found.  Assessment/Plan   ICD-10-CM   1. Mixed hyperlipidemia E78.2   2. Essential hypertension I10   3. 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 Fasting labs prior to appt (cbc w diff, cmp, lipid panel, tsh)     Undra Harriman S. Perlie Gold  [facility] [address] [place], [place] [address] [phone] Cell (Monday-Friday 8 AM - 5 PM) [phone] After 5 PM and follow prompts

## 2018-07-22 NOTE — Patient Instructions (Addendum)
Continue current medications as ordered  Follow up in 6 mos with Sherrie Mustache, NP for an EV/ECG. Fasting labs prior to appt

## 2018-07-22 NOTE — Progress Notes (Deleted)
   Subjective:    Patient ID: Laurie Decker, female    DOB: 1946/06/28, 72 y.o.   MRN: [id number]  HPI    Review of Systems     Objective:   Physical Exam        Assessment & Plan:

## 2018-09-08 ENCOUNTER — Encounter: Payer: Self-pay | Admitting: Internal Medicine

## 2018-09-08 ENCOUNTER — Ambulatory Visit (INDEPENDENT_AMBULATORY_CARE_PROVIDER_SITE_OTHER): Payer: Medicare Other | Admitting: Internal Medicine

## 2018-09-08 ENCOUNTER — Other Ambulatory Visit: Payer: Self-pay

## 2018-09-08 VITALS — BP 145/81 | HR 78 | Temp 97.9°F | Ht 63.0 in | Wt 147.8 lb

## 2018-09-08 DIAGNOSIS — E782 Mixed hyperlipidemia: Secondary | ICD-10-CM

## 2018-09-08 DIAGNOSIS — Z8619 Personal history of other infectious and parasitic diseases: Secondary | ICD-10-CM

## 2018-09-08 DIAGNOSIS — I10 Essential (primary) hypertension: Secondary | ICD-10-CM

## 2018-09-08 DIAGNOSIS — Z87891 Personal history of nicotine dependence: Secondary | ICD-10-CM

## 2018-09-08 DIAGNOSIS — Z79899 Other long term (current) drug therapy: Secondary | ICD-10-CM | POA: Diagnosis not present

## 2018-09-08 NOTE — Assessment & Plan Note (Signed)
Assessment: Essential hypertension Blood pressure today was 145/81 and on repeat was 151/85.  She is currently on lisinopril 20 mg daily.  She reports blood pressure readings at home of systolic [id number].  Will not make changes at this time. Will reassess at next visit in 6 months and discuss changes with medications if still elevated.    Plan - continue lisinopril 20 mg daily -Reassess blood pressure in 6 months

## 2018-09-08 NOTE — Assessment & Plan Note (Signed)
Assessment: Mixed hyperlipidemia Lipid panel on on 07/2018 showed total cholesterol 226 and LDL of 125. Her ASCVD risk is 17% and a moderate to high intensity statin is recommended. This was discussed with patient and patient does not want to start a statin at this time  Plan -monitor

## 2018-09-08 NOTE — Assessment & Plan Note (Signed)
Assessment: History of oral HSV Stable on Valtrex. She uses it as needed and last outbreak was one year ago.  Plan -Valtrex

## 2018-09-08 NOTE — Progress Notes (Signed)
   CC: establish care for chronic medical conditions including essential hypertension and hyperlipidemia  HPI:  [name] is a 72 y.o. female with history noted below that presents to the acute care clinic to establish care for chronic medical conditions including essential hypertension and hyperlipidemia. Please see problem based charting for the status of patient's chronic medical conditions.  Past Medical History:  Diagnosis Date  . High blood pressure   . Hyperlipidemia    Per previous Cardiology Record   . Impaired fasting glucose    Per previous Cardiology records   . Macrocytosis 04/02/2016   Per previous Cardiology records   . Osteopenia 04/02/2016   Per previous Cardiology records     Review of Systems:  Review of Systems  Respiratory: Negative for shortness of breath.   Cardiovascular: Negative for chest pain.  Gastrointestinal: Negative for abdominal pain.  Musculoskeletal: Negative for falls and joint pain.     Physical Exam:  Vitals:   09/08/18 0913  BP: (!) 145/81  Pulse: 78  Temp: 97.9 F (36.6 C)  TempSrc: Oral  SpO2: 97%  Weight: 147 lb 12.8 oz (67 kg)  Height: 5\' 3"  (1.6 m)   Physical Exam  Constitutional: She is well-developed, well-nourished, and in no distress.  Cardiovascular: Normal rate, regular rhythm and normal heart sounds. Exam reveals no gallop and no friction rub.  No murmur heard. Pulmonary/Chest: Effort normal and breath sounds normal. No respiratory distress. She has no wheezes. She has no rales.  Musculoskeletal: She exhibits no edema.  Skin: Skin is warm and dry. No rash noted. No erythema.     Assessment & Plan:   See encounters tab for problem based medical decision making.   Patient discussed with Dr. Lynnae January

## 2018-09-08 NOTE — Progress Notes (Signed)
Internal Medicine Clinic Attending  Case discussed with Dr. Hoffman at the time of the visit.  We reviewed the resident's history and exam and pertinent patient test results.  I agree with the assessment, diagnosis, and plan of care documented in the resident's note.  

## 2018-09-08 NOTE — Patient Instructions (Signed)
[name],  It was a pleasure meeting you today. Please follow up in 6 months.

## 2018-09-13 NOTE — Addendum Note (Signed)
Addended by: Kalman Shan R on: 09/13/2018 10:40 PM   Modules accepted: Level of Service

## 2018-12-08 DIAGNOSIS — H25012 Cortical age-related cataract, left eye: Secondary | ICD-10-CM | POA: Diagnosis not present

## 2018-12-08 DIAGNOSIS — H2513 Age-related nuclear cataract, bilateral: Secondary | ICD-10-CM | POA: Diagnosis not present

## 2018-12-15 ENCOUNTER — Telehealth: Payer: Self-pay | Admitting: *Deleted

## 2018-12-15 NOTE — Telephone Encounter (Signed)
Call from pt's husband, Suezanne Jacquet - stated pt is having shortness of breath and wants pt to be seen earlier than June. Stated he has an appt on 3/20 with Dr Eppie Gibson and would like to schedule same day since it's difficult to get her ready. I asked if pt will remain stable until then;stated he thinks so, she has the sob from time to time. Scheduled an appt on 3/20 in [facility] since Dr Heber Llano is here all month. Instructed husband to take pt to UC/ED if her condition worsens-verbalized understanding, stated he will.

## 2018-12-15 NOTE — Telephone Encounter (Signed)
Patient can also be seen in the [facility] earlier if needed

## 2018-12-25 ENCOUNTER — Ambulatory Visit: Payer: Medicare Other

## 2019-01-22 ENCOUNTER — Other Ambulatory Visit: Payer: Medicare Other

## 2019-01-22 ENCOUNTER — Ambulatory Visit: Payer: Self-pay

## 2019-01-26 ENCOUNTER — Ambulatory Visit: Payer: Medicare Other | Admitting: Nurse Practitioner

## 2019-01-26 ENCOUNTER — Encounter: Payer: Medicare Other | Admitting: Nurse Practitioner

## 2019-02-10 ENCOUNTER — Ambulatory Visit (INDEPENDENT_AMBULATORY_CARE_PROVIDER_SITE_OTHER): Payer: Medicare Other | Admitting: Family

## 2019-02-10 ENCOUNTER — Other Ambulatory Visit: Payer: Self-pay

## 2019-02-10 ENCOUNTER — Other Ambulatory Visit: Payer: Self-pay | Admitting: Internal Medicine

## 2019-02-10 ENCOUNTER — Encounter: Payer: Self-pay | Admitting: Family

## 2019-02-10 VITALS — BP 138/88 | HR 71 | Temp 97.8°F

## 2019-02-10 DIAGNOSIS — E782 Mixed hyperlipidemia: Secondary | ICD-10-CM | POA: Diagnosis not present

## 2019-02-10 DIAGNOSIS — I10 Essential (primary) hypertension: Secondary | ICD-10-CM

## 2019-02-10 DIAGNOSIS — M7918 Myalgia, other site: Secondary | ICD-10-CM | POA: Diagnosis not present

## 2019-02-10 DIAGNOSIS — R21 Rash and other nonspecific skin eruption: Secondary | ICD-10-CM

## 2019-02-10 DIAGNOSIS — R921 Mammographic calcification found on diagnostic imaging of breast: Secondary | ICD-10-CM

## 2019-02-10 MED ORDER — LISINOPRIL 20 MG PO TABS: 20.0000 mg | ORAL_TABLET | Freq: Every day | ORAL | 1 refills | Status: DC

## 2019-02-10 NOTE — Progress Notes (Signed)
This service is provided via telemedicine  No vital signs collected/recorded due to the encounter was a telemedicine visit.   Location of patient (ex: home, work):  Home   Patient consents to a telephone visit:  Yes  Location of the provider (ex: office, home):  Office   Name of any referring provider:    Names of all persons participating in the telemedicine service and their role in the encounter:  Ruthell Rummage CMA, Symir Mah NP, Thompson Caul  Time spent on call:  Ruthell Rummage CMA spent 7 Minutes on phone with patient.  Provider: [name] FNP-C   Trichelle Lehan, Nelda Bucks, NP  Patient Care Team: Omari Mcmanaway, Nelda Bucks, NP as PCP - General (Family Medicine)  Extended Emergency Contact Information Primary Emergency Contact: Lynea, [place] Mobile Phone: [phone] Relation: Spouse   Goals of care: Advanced Directive information Advanced Directives 02/10/2019  Does Patient Have a Medical Advance Directive? Yes  Type of Advance Directive Living will;Healthcare Power of [place];Out of facility DNR (pink MOST or yellow form)  Does patient want to make changes to medical advance directive? No - Patient declined  Copy of [place] in Chart? No - copy requested  Pre-existing out of facility DNR order (yellow form or pink MOST form) Yellow form placed in chart (order not valid for inpatient use)     Chief Complaint  Patient presents with   Establish Care    Re-establish Care    Quality Metric Gaps    Hepatitis C Screening     HPI:  Pt is a 73 y.o. female seen today to re-established care with practice for medical management of chronic diseases.she denies any acute issues during visit.she states she has been coping well with current COVID-19 restrictions.Denies any symptoms of depression.she denies any cough or exposure to persons with COVID -19    Hypertension - she checks her blood pressure daily readings ranges in the 120's/70's-130's/80's.she  denies any headache,dizziness,chest pain,shortness of breath or faint feeling.currently on lisinopril 20 mg tablet daily.she needs refill today.last CR 0.74 (07/17/2018).   Hyperlipidemia - No recent labs.takes Flaxseed 1000 mg tablet daily.  Rash - states continues to have intermittent rash breaks out on the arms,neck face at times.has been to several specialist but no known etiology thought possible autoimmune disorder.has not had a break out in a while.she states has Prednisone that was given in the past to take in case of breakout.no itching,fever or chills.    Hx HSV - no cold sore.on Vatrex 1000 mg tablet 2000 mg tablet as needed.   Right side pain - she describes pain as intermittent.takes Aleve 220 mg tablet daily as needed.states pain thought to be muscular skeleton in nature.   Past Medical History:  Diagnosis Date   High blood pressure    Hyperlipidemia    Per previous Cardiology Record    Impaired fasting glucose    Per previous Cardiology records    Macrocytosis 04/02/2016   Per previous Cardiology records    Osteopenia 04/02/2016   Per previous Cardiology records    Past Surgical History:  Procedure Laterality Date   APPENDECTOMY     APPENDECTOMY  1977   BREAST BIOPSY  03/07/2014   Benign   BUNIONECTOMY  2000   COLONOSCOPY  10/07/2012   Per previous Cardiology records    TONSILLECTOMY AND ADENOIDECTOMY  10/08/1955   Per previous Cardiology records     Allergies  Allergen Reactions   Latex     Allergies  as of 02/10/2019      Reactions   Latex       Medication List       Accurate as of Feb 10, 2019  1:25 PM. Always use your most recent med list.        CENTRUM ADULTS PO Take by mouth daily.   Flaxseed Oil 1000 MG Caps Take by mouth daily.   lisinopril 20 MG tablet Commonly known as:  ZESTRIL Take 1 tablet (20 mg total) by mouth daily.   Lysine HCl 1000 MG Tabs Take by mouth daily.   naproxen sodium 220 MG tablet Commonly known as:   ALEVE Take 220 mg by mouth as needed.   valACYclovir 1000 MG tablet Commonly known as:  Valtrex Take 2 tablets (2,000 mg total) by mouth as needed (for cold sore).       Review of Systems  Constitutional: Negative for appetite change, chills, fatigue and fever.  HENT: Negative for congestion, hearing loss, rhinorrhea, sinus pressure, sinus pain, sneezing, sore throat and trouble swallowing.   Eyes: Positive for visual disturbance. Negative for pain, discharge, redness and itching.       Wears eye glasses follows up with ophthalmology   Respiratory: Negative for cough, chest tightness, shortness of breath and wheezing.   Cardiovascular: Negative for chest pain, palpitations and leg swelling.  Gastrointestinal: Negative for abdominal distention, abdominal pain, constipation, diarrhea, nausea and vomiting.  Endocrine: Negative for cold intolerance, heat intolerance, polydipsia, polyphagia and polyuria.  Genitourinary: Positive for urgency. Negative for difficulty urinating, dysuria, flank pain and frequency.  Musculoskeletal: Positive for arthralgias. Negative for gait problem.  Skin: Positive for rash. Negative for color change and pallor.  Neurological: Negative for dizziness, light-headedness and headaches.  Hematological: Does not bruise/bleed easily.  Psychiatric/Behavioral: Negative for agitation, confusion and sleep disturbance. The patient is not nervous/anxious.     Immunization History  Administered Date(s) Administered   Hepatitis A 04/26/2004, 12/14/2004   Hepatitis B, adult 10/07/2005   IPV 07/15/2008   Td 07/21/1976, 02/03/2004   Tdap 07/15/2008   Tetanus 07/22/2018   Yellow Fever 02/03/2004   Pertinent  Health Maintenance Due  Topic Date Due   PNA vac Low Risk Adult (1 of 2 - PCV13) 07/23/2019 (Originally 09/24/2011)   MAMMOGRAM  03/27/2020   COLONOSCOPY  03/11/2022   DEXA SCAN  Completed   INFLUENZA VACCINE  Discontinued   Fall Risk  02/10/2019  09/08/2018 07/22/2018 01/20/2018 01/20/2018  Falls in the past year? 0 0 No No No  Number falls in past yr: 0 - - - -  Injury with Fall? 0 - - - -  Comment - - - - -    Vitals:   02/10/19 1313  BP: 138/88  Pulse: 71  Temp: 97.8 F (36.6 C)  TempSrc: Oral   There is no height or weight on file to calculate BMI. Physical Exam Unable to complete on Telephone visit.   Labs reviewed: Recent Labs    07/17/18 0829  NA 138  K 4.3  CL 99  CO2 29  GLUCOSE 85  BUN 17  CREATININE 0.74  CALCIUM 9.8    Lab Results  Component Value Date   TSH 2.80 01/16/2018   No results found for: HGBA1C Lab Results  Component Value Date   CHOL 226 (H) 07/17/2018   HDL 79 07/17/2018   LDLCALC 125 (H) 07/17/2018   TRIG 109 07/17/2018   CHOLHDL 2.9 07/17/2018    Significant Diagnostic Results in last 30  days:  No results found.  Assessment/Plan  1. Essential hypertension B/p readings reviewed with patient stable.Continue on lisinopril 20 mg tablet daily. CBC/diff,CMP,TSH level,Future   2. Mixed hyperlipidemia Continue Flaxseed 1000 mg tablet daily.low carbohydrate,low saturated fats and high vegetable diet recommended.continue exercise three times per week at the [facility].  Lipid panel,future   3. Musculoskeletal pain She describes pain as intermittent.No worsening symptoms.has been evaluated by specialist several times auto immune disease suspected. Aleve 220 mg tablet daily as needed.  4. Rash and nonspecific skin eruption Afebrile.Has had no recent break out of rash does have prednisone tablets on hand previous given by previous provider.Continue to monitor for now.   Family/ staff Communication: Reviewed plan of care with patient.   Labs/tests ordered: CBC/diff,CMP,TSH level,Future   Time spent with patient 23 minutes >50% time spent counseling; reviewing medical record; tests; labs; and developing future plan of care  Sandrea Hughs, NP

## 2019-02-16 ENCOUNTER — Other Ambulatory Visit: Payer: Self-pay

## 2019-02-16 DIAGNOSIS — I10 Essential (primary) hypertension: Secondary | ICD-10-CM

## 2019-02-16 DIAGNOSIS — E782 Mixed hyperlipidemia: Secondary | ICD-10-CM

## 2019-02-17 ENCOUNTER — Other Ambulatory Visit: Payer: Medicare Other

## 2019-02-17 ENCOUNTER — Other Ambulatory Visit: Payer: Self-pay

## 2019-02-17 DIAGNOSIS — I10 Essential (primary) hypertension: Secondary | ICD-10-CM

## 2019-02-17 DIAGNOSIS — E782 Mixed hyperlipidemia: Secondary | ICD-10-CM | POA: Diagnosis not present

## 2019-02-18 LAB — CBC WITH DIFFERENTIAL/PLATELET
Absolute Monocytes: 578 cells/uL (ref 200–950)
Basophils Absolute: 29 cells/uL (ref 0–200)
Basophils Relative: 0.6 %
Eosinophils Absolute: 137 cells/uL (ref 15–500)
Eosinophils Relative: 2.8 %
HCT: 42 % (ref 35.0–45.0)
Hemoglobin: 14.5 g/dL (ref 11.7–15.5)
Lymphs Abs: 1362 cells/uL (ref 850–3900)
MCH: 33.6 pg — ABNORMAL HIGH (ref 27.0–33.0)
MCHC: 34.5 g/dL (ref 32.0–36.0)
MCV: 97.4 fL (ref 80.0–100.0)
MPV: 10.9 fL (ref 7.5–12.5)
Monocytes Relative: 11.8 %
Neutro Abs: 2793 cells/uL (ref 1500–7800)
Neutrophils Relative %: 57 %
Platelets: 174 10*3/uL (ref 140–400)
RBC: 4.31 10*6/uL (ref 3.80–5.10)
RDW: 12.5 % (ref 11.0–15.0)
Total Lymphocyte: 27.8 %
WBC: 4.9 10*3/uL (ref 3.8–10.8)

## 2019-02-18 LAB — COMPLETE METABOLIC PANEL WITH GFR
AG Ratio: 1.7 (calc) (ref 1.0–2.5)
ALT: 18 U/L (ref 6–29)
AST: 25 U/L (ref 10–35)
Albumin: 4.5 g/dL (ref 3.6–5.1)
Alkaline phosphatase (APISO): 65 U/L (ref 37–153)
BUN: 22 mg/dL (ref 7–25)
CO2: 29 mmol/L (ref 20–32)
Calcium: 10.4 mg/dL (ref 8.6–10.4)
Chloride: 99 mmol/L (ref 98–110)
Creat: 0.63 mg/dL (ref 0.60–0.93)
GFR, Est African American: 104 mL/min/{1.73_m2} (ref >=60)
GFR, Est Non African American: 90 mL/min/{1.73_m2} (ref >=60)
Globulin: 2.6 g/dL (calc) (ref 1.9–3.7)
Glucose, Bld: 88 mg/dL (ref 65–99)
Potassium: 4.5 mmol/L (ref 3.5–5.3)
Sodium: 138 mmol/L (ref 135–146)
Total Bilirubin: 0.7 mg/dL (ref 0.2–1.2)
Total Protein: 7.1 g/dL (ref 6.1–8.1)

## 2019-02-18 LAB — LIPID PANEL
Cholesterol: 240 mg/dL — ABNORMAL HIGH (ref <200)
HDL: 88 mg/dL (ref >=50)
LDL Cholesterol (Calc): 128 mg/dL (calc) — ABNORMAL HIGH
Non-HDL Cholesterol (Calc): 152 mg/dL (calc) — ABNORMAL HIGH (ref <130)
Total CHOL/HDL Ratio: 2.7 (calc) (ref <5.0)
Triglycerides: 125 mg/dL (ref <150)

## 2019-02-18 LAB — TSH: TSH: 2.84 mIU/L (ref 0.40–4.50)

## 2019-03-11 ENCOUNTER — Encounter: Payer: Medicare Other | Admitting: Internal Medicine

## 2019-03-25 ENCOUNTER — Other Ambulatory Visit: Payer: Self-pay | Admitting: Family

## 2019-03-26 ENCOUNTER — Other Ambulatory Visit: Payer: Self-pay | Admitting: Family

## 2019-03-29 ENCOUNTER — Ambulatory Visit
Admission: RE | Admit: 2019-03-29 | Discharge: 2019-03-29 | Disposition: A | Discharge status: Home / Self Care | Payer: Medicare Other | Source: Ambulatory Visit | Attending: Family | Admitting: Family

## 2019-03-29 ENCOUNTER — Other Ambulatory Visit: Payer: Self-pay

## 2019-03-29 DIAGNOSIS — R921 Mammographic calcification found on diagnostic imaging of breast: Secondary | ICD-10-CM

## 2019-06-07 ENCOUNTER — Other Ambulatory Visit: Payer: Self-pay

## 2019-06-07 DIAGNOSIS — I10 Essential (primary) hypertension: Secondary | ICD-10-CM

## 2019-06-07 DIAGNOSIS — E782 Mixed hyperlipidemia: Secondary | ICD-10-CM

## 2019-06-08 ENCOUNTER — Other Ambulatory Visit: Payer: Self-pay

## 2019-06-08 ENCOUNTER — Other Ambulatory Visit: Payer: Medicare Other

## 2019-06-08 DIAGNOSIS — E782 Mixed hyperlipidemia: Secondary | ICD-10-CM | POA: Diagnosis not present

## 2019-06-08 DIAGNOSIS — I10 Essential (primary) hypertension: Secondary | ICD-10-CM | POA: Diagnosis not present

## 2019-06-08 DIAGNOSIS — R739 Hyperglycemia, unspecified: Secondary | ICD-10-CM | POA: Diagnosis not present

## 2019-06-09 LAB — HEMOGLOBIN A1C
Hgb A1c MFr Bld: 5.3 % of total Hgb (ref <5.7)
Mean Plasma Glucose: 105 (calc)
eAG (mmol/L): 5.8 (calc)

## 2019-06-09 LAB — COMPLETE METABOLIC PANEL WITH GFR
AG Ratio: 1.8 (calc) (ref 1.0–2.5)
ALT: 16 U/L (ref 6–29)
AST: 22 U/L (ref 10–35)
Albumin: 4.1 g/dL (ref 3.6–5.1)
Alkaline phosphatase (APISO): 53 U/L (ref 37–153)
BUN: 19 mg/dL (ref 7–25)
CO2: 29 mmol/L (ref 20–32)
Calcium: 9.6 mg/dL (ref 8.6–10.4)
Chloride: 100 mmol/L (ref 98–110)
Creat: 0.86 mg/dL (ref 0.60–0.93)
GFR, Est African American: 78 mL/min/{1.73_m2} (ref >=60)
GFR, Est Non African American: 67 mL/min/{1.73_m2} (ref >=60)
Globulin: 2.3 g/dL (calc) (ref 1.9–3.7)
Glucose, Bld: 103 mg/dL — ABNORMAL HIGH (ref 65–99)
Potassium: 4.4 mmol/L (ref 3.5–5.3)
Sodium: 138 mmol/L (ref 135–146)
Total Bilirubin: 0.7 mg/dL (ref 0.2–1.2)
Total Protein: 6.4 g/dL (ref 6.1–8.1)

## 2019-06-09 LAB — LIPID PANEL
Cholesterol: 234 mg/dL — ABNORMAL HIGH (ref <200)
HDL: 60 mg/dL (ref >=50)
LDL Cholesterol (Calc): 147 mg/dL (calc) — ABNORMAL HIGH
Non-HDL Cholesterol (Calc): 174 mg/dL (calc) — ABNORMAL HIGH (ref <130)
Total CHOL/HDL Ratio: 3.9 (calc) (ref <5.0)
Triglycerides: 142 mg/dL (ref <150)

## 2019-06-09 LAB — CBC WITH DIFFERENTIAL/PLATELET
Absolute Monocytes: 682 cells/uL (ref 200–950)
Basophils Absolute: 37 cells/uL (ref 0–200)
Basophils Relative: 0.6 %
Eosinophils Absolute: 484 cells/uL (ref 15–500)
Eosinophils Relative: 7.8 %
HCT: 43.3 % (ref 35.0–45.0)
Hemoglobin: 14.4 g/dL (ref 11.7–15.5)
Lymphs Abs: 1600 cells/uL (ref 850–3900)
MCH: 33.1 pg — ABNORMAL HIGH (ref 27.0–33.0)
MCHC: 33.3 g/dL (ref 32.0–36.0)
MCV: 99.5 fL (ref 80.0–100.0)
MPV: 10.8 fL (ref 7.5–12.5)
Monocytes Relative: 11 %
Neutro Abs: 3398 cells/uL (ref 1500–7800)
Neutrophils Relative %: 54.8 %
Platelets: 198 10*3/uL (ref 140–400)
RBC: 4.35 10*6/uL (ref 3.80–5.10)
RDW: 11.8 % (ref 11.0–15.0)
Total Lymphocyte: 25.8 %
WBC: 6.2 10*3/uL (ref 3.8–10.8)

## 2019-06-09 LAB — TEST AUTHORIZATION

## 2019-06-15 ENCOUNTER — Ambulatory Visit (INDEPENDENT_AMBULATORY_CARE_PROVIDER_SITE_OTHER): Payer: Medicare Other | Admitting: Family

## 2019-06-15 ENCOUNTER — Encounter: Payer: Self-pay | Admitting: Family

## 2019-06-15 ENCOUNTER — Encounter (INDEPENDENT_AMBULATORY_CARE_PROVIDER_SITE_OTHER): Payer: Self-pay

## 2019-06-15 ENCOUNTER — Other Ambulatory Visit: Payer: Self-pay

## 2019-06-15 VITALS — BP 138/80 | HR 81 | Temp 97.5°F | Ht 63.0 in | Wt 149.8 lb

## 2019-06-15 DIAGNOSIS — I10 Essential (primary) hypertension: Secondary | ICD-10-CM

## 2019-06-15 DIAGNOSIS — M7918 Myalgia, other site: Secondary | ICD-10-CM

## 2019-06-15 DIAGNOSIS — E782 Mixed hyperlipidemia: Secondary | ICD-10-CM
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
## 2019-06-25 ENCOUNTER — Encounter: Payer: Self-pay | Admitting: Neurology

## 2019-06-25 ENCOUNTER — Other Ambulatory Visit: Payer: Self-pay | Admitting: Family

## 2019-06-25 ENCOUNTER — Telehealth: Payer: Self-pay

## 2019-06-25 DIAGNOSIS — R202 Paresthesia of skin: Secondary | ICD-10-CM

## 2019-06-25 DIAGNOSIS — R200 Anesthesia of skin: Secondary | ICD-10-CM

## 2019-06-25 NOTE — Telephone Encounter (Signed)
Patient is requesting a referral to a [facility] for numbness in right hand at night. Patient states she has mentioned this to [place].  I advised patient that she may need an appointment to discuss and she states lets see what Webb Silversmith says.   Please advise

## 2019-06-25 NOTE — Telephone Encounter (Signed)
Will send referral I saw recently.

## 2019-07-19 ENCOUNTER — Ambulatory Visit (INDEPENDENT_AMBULATORY_CARE_PROVIDER_SITE_OTHER): Payer: Medicare Other | Admitting: Neurology

## 2019-07-19 ENCOUNTER — Encounter: Payer: Self-pay | Admitting: Neurology

## 2019-07-19 ENCOUNTER — Other Ambulatory Visit: Payer: Self-pay

## 2019-07-19 ENCOUNTER — Other Ambulatory Visit (INDEPENDENT_AMBULATORY_CARE_PROVIDER_SITE_OTHER): Payer: Medicare Other

## 2019-07-19 VITALS — BP 128/70 | HR 78 | Ht 63.0 in | Wt 151.0 lb

## 2019-07-19 DIAGNOSIS — R202 Paresthesia of skin: Secondary | ICD-10-CM | POA: Diagnosis not present

## 2019-07-19 DIAGNOSIS — E569 Vitamin deficiency, unspecified: Secondary | ICD-10-CM

## 2019-07-19 NOTE — Progress Notes (Signed)
[facility] Clinic Note - Initial Visit   Date: 07/19/19  Keili Sferra MRN: [id number] DOB: 1946/04/08   Dear Laurie Sax, NP:  Thank you for your kind referral of Laurie Decker for consultation of right hand numbness. Although her history is well known to you, please allow [name] to reiterate it for the purpose of our medical record. The patient was accompanied to the clinic by self.    History of Present Illness: Laurie Decker is a 73 y.o. right-handed female with hypertension and hyperlipidemia presenting for evaluation of right hand numbness.   Starting around mid-September, she began having numbness and soreness over the middle finger which occurs only at night time.  She does not have any discomfort or pain during the day.  She has tried shaking her hand which has not provided any relief.  She has tried Aleve no with relief.  She denies any weakness, neck pain, or radicular pain.  She denies similar symptoms in the left hand.  She denies engaging in any repetitive activities of the hand.  She is retired Marine scientist.  She drinks 3 glasses of wine daily and was previously drinking vodka for many years.      Out-side paper records, electronic medical record, and images have been reviewed where available and summarized as:  Lab Results  Component Value Date   HGBA1C 5.3 06/08/2019   Lab Results  Component Value Date   TSH 2.84 02/17/2019    Past Medical History:  Diagnosis Date  . High blood pressure   . Hyperlipidemia    Per previous Cardiology Record   . Impaired fasting glucose    Per previous Cardiology records   . Macrocytosis 04/02/2016   Per previous Cardiology records   . Osteopenia 04/02/2016   Per previous Cardiology records     Past Surgical History:  Procedure Laterality Date  . APPENDECTOMY    . APPENDECTOMY  1977  . BREAST BIOPSY  03/07/2014   Benign  . BUNIONECTOMY  2000  . COLONOSCOPY  10/07/2012   Per previous  Cardiology records   . TONSILLECTOMY AND ADENOIDECTOMY  10/08/1955   Per previous Cardiology records      Medications:  Outpatient Encounter Medications as of 07/19/2019  Medication Sig  . Flaxseed, Linseed, (FLAXSEED OIL) 1000 MG CAPS Take by mouth daily.  Marland Kitchen lisinopril (ZESTRIL) 20 MG tablet Take 1 tablet (20 mg total) by mouth daily.  Marland Kitchen Lysine HCl 1000 MG TABS Take by mouth daily.  . Multiple Vitamins-Minerals (CENTRUM ADULTS PO) Take by mouth daily.  . naproxen sodium (ALEVE) 220 MG tablet Take 220 mg by mouth as needed.  . valACYclovir (VALTREX) 1000 MG tablet Take 2 tablets (2,000 mg total) by mouth as needed (for cold sore).   No facility-administered encounter medications on file as of 07/19/2019.     Allergies:  Allergies  Allergen Reactions  . Latex     Family History: Family History  Problem Relation Age of Onset  . Heart disease Mother   . Alcoholism Father   . Colon cancer Father        malignant tumor of colon     Social History: Social History   Tobacco Use  . Smoking status: Former Smoker    Packs/day: 3.00  . Smokeless tobacco: Never Used  . Tobacco comment: Quit in 20s  Substance Use Topics  . Alcohol use: Yes    Alcohol/week: 3.0 standard drinks    Types: 3 Standard drinks or equivalent per week  Comment: 3 glasses of wine daily  . Drug use: No   Social History   Social History Narrative   Diet: N/A      Caffeine: 1 cup of coffee daily      Married, if yes what year: 2014      Do you live in a house, apartment, assisted living, condo, trailer, ect: House, 1 stories, 2 persons       Pets: 1 dog       Current/Past profession: Nursing School       Exercise: Yes, daily   rigth handed      Living Will:  Yes   DNR: Yes   POA/HPOA: Yes       Functional Status:   Do you have difficulty bathing or dressing yourself? No   Do you have difficulty preparing food or eating? No   Do you have difficulty managing your medications? No   Do  you have difficulty managing your finances? No   Do you have difficulty affording your medications? No    Review of Systems:  CONSTITUTIONAL: No fevers, chills, night sweats, or weight loss.   EYES: No visual changes or eye pain ENT: No hearing changes.  No history of nose bleeds.   RESPIRATORY: No cough, wheezing and shortness of breath.   CARDIOVASCULAR: Negative for chest pain, and palpitations.   GI: Negative for abdominal discomfort, blood in stools or black stools.  No recent change in bowel habits.   GU:  No history of incontinence.   MUSCLOSKELETAL: No history of joint pain or swelling.  No myalgias.   SKIN: Negative for lesions, rash, and itching.   HEMATOLOGY/ONCOLOGY: Negative for prolonged bleeding, bruising easily, and swollen nodes.  No history of cancer.   ENDOCRINE: Negative for cold or heat intolerance, polydipsia or goiter.   PSYCH:  No depression or anxiety symptoms.   NEURO: As Above.   Vital Signs:  BP 128/70   Pulse 78   Ht 5\' 3"  (1.6 m)   Wt 151 lb (68.5 kg)   SpO2 98%   BMI 26.75 kg/m    General Medical Exam:   General:  Well appearing, comfortable.   Eyes/ENT: see cranial nerve examination.   Neck:   No carotid bruits. Respiratory:  Clear to auscultation, good air entry bilaterally.   Cardiac:  Regular rate and rhythm, no murmur.   Extremities:  No deformities, edema, or skin discoloration.  Skin:  No rashes or lesions.  Neurological Exam: MENTAL STATUS including orientation to time, place, person, recent and remote memory, attention span and concentration, language, and fund of knowledge is normal.  Speech is not dysarthric.  CRANIAL NERVES: II:  No visual field defects.  III-IV-VI: Pupils equal round and reactive to light.  Normal conjugate, extra-ocular eye movements in all directions of gaze.  No nystagmus.  No ptosis.   V:  Normal facial sensation.    VII:  Normal facial symmetry and movements.   VIII:  Normal hearing and vestibular  function.   IX-X:  Normal palatal movement.   XI:  Normal shoulder shrug and head rotation.   XII:  Normal tongue strength and range of motion, no deviation or fasciculation.  MOTOR:  No atrophy, fasciculations or abnormal movements.  No pronator drift.   Upper Extremity:  Right  Left  Deltoid  5/5   5/5   Biceps  5/5   5/5   Triceps  5/5   5/5   Infraspinatus 5/5  5/5  Medial  pectoralis 5/5  5/5  Wrist extensors  5/5   5/5   Wrist flexors  5/5   5/5   Finger extensors  5/5   5/5   Finger flexors  5/5   5/5   Dorsal interossei  5/5   5/5   Abductor pollicis  5/5   5/5   Tone (Ashworth scale)  0  0   Lower Extremity:  Right  Left  Hip flexors  5/5   5/5   Hip extensors  5/5   5/5   Adductor 5/5  5/5  Abductor 5/5  5/5  Knee flexors  5/5   5/5   Knee extensors  5/5   5/5   Dorsiflexors  5/5   5/5   Plantarflexors  5/5   5/5   Toe extensors  5/5   5/5   Toe flexors  5/5   5/5   Tone (Ashworth scale)  0  0   MSRs:  Right        Left                  brachioradialis 2+  2+  biceps 2+  2+  triceps 2+  2+  patellar 2+  2+  ankle jerk 2+  2+  Hoffman no  no  plantar response down  down   SENSORY:  Normal and symmetric perception of light touch, pinprick, vibration, and proprioception.  Romberg's sign absent.   COORDINATION/GAIT: Normal finger-to- nose-finger and heel-to-shin.  Intact rapid alternating movements bilaterally.  Able to rise from a chair without using arms.  Gait narrow based and stable. Tandem and stressed gait intact.    IMPRESSION: 1.  Right hand paresthesias suggestive of carpal tunnel syndrome  - NCS/EMG of the right arm  - Start using wrist splint at night time  2.  Alcohol use  - Check vitamin B12, folate, and vitamin B1  - She is consuming 3 glasses of wine nightly.  Counseled on reducing alcohol consumption  Further recommendations pending results.   Thank you for allowing me to participate in patient's care.  If I can answer any additional  questions, I would be pleased to do so.    Sincerely,    Denali Becvar K. Posey Pronto, DO

## 2019-07-19 NOTE — Patient Instructions (Addendum)
Start using wrist splint at night time  Nerve testing of the right arm/hand.  Do no apply any lotion on the day of testing.  Check labs.  We will call you with the results   ELECTROMYOGRAM AND NERVE CONDUCTION STUDIES (EMG/NCS) INSTRUCTIONS  How to Prepare The neurologist conducting the EMG will need to know if you have certain medical conditions. Tell the neurologist and other EMG lab personnel if you: . Have a pacemaker or any other electrical medical device . Take blood-thinning medications . Have hemophilia, a blood-clotting disorder that causes prolonged bleeding Bathing Take a shower or bath shortly before your exam in order to remove oils from your skin. Don't apply lotions or creams before the exam.  What to Expect You'll likely be asked to change into a hospital gown for the procedure and lie down on an examination table. The following explanations can help you understand what will happen during the exam.  . Electrodes. The neurologist or a technician places surface electrodes at various locations on your skin depending on where you're experiencing symptoms. Or the neurologist may insert needle electrodes at different sites depending on your symptoms.  . Sensations. The electrodes will at times transmit a tiny electrical current that you may feel as a twinge or spasm. The needle electrode may cause discomfort or pain that usually ends shortly after the needle is removed. If you are concerned about discomfort or pain, you may want to talk to the neurologist about taking a short break during the exam.  . Instructions. During the needle EMG, the neurologist will assess whether there is any spontaneous electrical activity when the muscle is at rest - activity that isn't present in healthy muscle tissue - and the degree of activity when you slightly contract the muscle.  He or she will give you instructions on resting and contracting a muscle at appropriate times. Depending on what muscles  and nerves the neurologist is examining, he or she may ask you to change positions during the exam.  After your EMG You may experience some temporary, minor bruising where the needle electrode was inserted into your muscle. This bruising should fade within several days. If it persists, contact your primary care doctor.

## 2019-07-23 ENCOUNTER — Ambulatory Visit: Payer: Medicare Other | Admitting: Neurology

## 2019-07-23 ENCOUNTER — Telehealth: Payer: Self-pay

## 2019-07-23 LAB — B12 AND FOLATE PANEL
Folate: >24 ng/mL
Vitamin B-12: 516 pg/mL (ref 200–1100)

## 2019-07-23 LAB — VITAMIN B1: Vitamin B1 (Thiamine): 36 nmol/L — ABNORMAL HIGH (ref 8–30)

## 2019-07-23 NOTE — Telephone Encounter (Signed)
Closed encounter °

## 2019-07-23 NOTE — Telephone Encounter (Signed)
Pt called

## 2019-08-17 ENCOUNTER — Other Ambulatory Visit: Payer: Self-pay

## 2019-08-17 ENCOUNTER — Ambulatory Visit (INDEPENDENT_AMBULATORY_CARE_PROVIDER_SITE_OTHER): Payer: Medicare Other | Admitting: Neurology

## 2019-08-17 DIAGNOSIS — G5601 Carpal tunnel syndrome, right upper limb: Secondary | ICD-10-CM

## 2019-08-17 DIAGNOSIS — M5412 Radiculopathy, cervical region: Secondary | ICD-10-CM

## 2019-08-17 DIAGNOSIS — E569 Vitamin deficiency, unspecified: Secondary | ICD-10-CM

## 2019-08-17 DIAGNOSIS — R202 Paresthesia of skin: Secondary | ICD-10-CM | POA: Diagnosis not present

## 2019-08-17 NOTE — Procedures (Signed)
[facility]  [address]  [place], [place] [address] Tel: [phone] Fax:  [phone] Test Date:  08/17/2019  Patient: Laurie Decker DOB: April 16, 1946 Physician: Narda Amber, DO  Sex: Female Height: 5\' 3"  Ref Phys: Narda Amber, DO  ID#: [id number] Temp: 35.0C Technician:    Patient Complaints: This is a 73 year-old female referred for evaluation of right hand paresthesias.   NCV & EMG Findings: Extensive electrodiagnostic testing of the right upper extremity shows:  1. Right median sensory response shows prolonged latency (4.3 ms).  Right ulnar sensory response is within normal limits. 2. Right median motor response shows prolonged latency (4.2 ms).  Right ulnar motor responses within normal limits. 3. Chronic motor axonal loss changes are seen affecting the pronator teres, biceps, and triceps muscles, without accompanied active denervation.  Impression: 1. 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  2. 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   ___________________________ Narda Amber, DO    Nerve Conduction Studies Anti Sensory Summary Table   Site NR Peak (ms) Norm Peak (ms) P-T Amp (V) Norm P-T Amp  Right Median Anti Sensory (2nd Digit)  Wrist    4.3 <3.8 12.8 >10  Right Ulnar Anti Sensory (5th Digit)  35C  Wrist    3.0 <3.2 23.5 >5   Motor Summary Table   Site NR Onset (ms) Norm Onset (ms) O-P Amp (mV) Norm O-P Amp Site1 Site2 Delta-0 (ms) Dist (cm) Vel (m/s) Norm Vel (m/s)  Right Median Motor (Abd Poll Brev)  35C  Wrist    4.2 <4.0 5.7 >5 Elbow Wrist 5.3 28.0 53 >50  Elbow    9.5  5.2         Right Ulnar Motor (Abd Dig Minimi)  35C  Wrist    2.2 <3.1 8.9 >7 B Elbow Wrist 3.7 22.0 59 >50  B Elbow    5.9  8.1  A Elbow B Elbow 1.9 10.0 53 >50  A Elbow    7.8  7.8          EMG   Side Muscle Ins Act Fibs  Psw Fasc Number Recrt Dur Dur. Amp Amp. Poly Poly. Comment  Right 1stDorInt Nml Nml Nml Nml Nml Nml Nml Nml Nml Nml Nml Nml N/A  Right Abd Poll Brev Nml Nml Nml Nml Nml Nml Nml Nml Nml Nml Nml Nml N/A  Right PronatorTeres Nml Nml Nml Nml 1- Rapid Some 1+ Some 1+ Some 1+ N/A  Right Biceps Nml Nml Nml Nml 1- Rapid Some 1+ Some 1+ Some 1+ N/A  Right Triceps Nml Nml Nml Nml 1- Rapid Some 1+ Some 1+ Some 1+ N/A  Right Deltoid Nml Nml Nml Nml Nml Nml Nml Nml Nml Nml Nml Nml N/A      Waveforms:

## 2019-08-18 ENCOUNTER — Telehealth: Payer: Self-pay | Admitting: Neurology

## 2019-08-18 ENCOUNTER — Telehealth: Payer: Self-pay

## 2019-08-18 ENCOUNTER — Other Ambulatory Visit: Payer: Self-pay

## 2019-08-18 DIAGNOSIS — G5601 Carpal tunnel syndrome, right upper limb: Secondary | ICD-10-CM

## 2019-08-18 NOTE — Telephone Encounter (Signed)
OK to refer to [facility] for right carpal tunnel syndrome.  Please fax my last note and EMG.  Thanks.

## 2019-08-18 NOTE — Telephone Encounter (Signed)
Patient called back does want to go to hand specialist, who do you prefer she see?

## 2019-08-18 NOTE — Telephone Encounter (Signed)
Referral placed to [facility] and faxed notes, insurance, emg.

## 2019-08-18 NOTE — Telephone Encounter (Signed)
Pt notified of results, will wait on referrals at this time and follow up in 3 months.

## 2019-08-18 NOTE — Telephone Encounter (Signed)
Patient is calling in wanting to a referral to be sent for the hand specialist that was discussed recently. Thanks!

## 2019-12-08 ENCOUNTER — Other Ambulatory Visit: Payer: Medicare Other

## 2019-12-08 ENCOUNTER — Other Ambulatory Visit: Payer: Self-pay

## 2019-12-08 DIAGNOSIS — E782 Mixed hyperlipidemia: Secondary | ICD-10-CM

## 2019-12-08 DIAGNOSIS — I10 Essential (primary) hypertension: Secondary | ICD-10-CM | POA: Diagnosis not present

## 2019-12-08 LAB — COMPLETE METABOLIC PANEL WITH GFR
AG Ratio: 2 (calc) (ref 1.0–2.5)
ALT: 14 U/L (ref 6–29)
AST: 18 U/L (ref 10–35)
Albumin: 4.2 g/dL (ref 3.6–5.1)
Alkaline phosphatase (APISO): 57 U/L (ref 37–153)
BUN: 18 mg/dL (ref 7–25)
CO2: 27 mmol/L (ref 20–32)
Calcium: 9.7 mg/dL (ref 8.6–10.4)
Chloride: 99 mmol/L (ref 98–110)
Creat: 0.75 mg/dL (ref 0.60–0.93)
GFR, Est African American: 92 mL/min/{1.73_m2} (ref >=60)
GFR, Est Non African American: 79 mL/min/{1.73_m2} (ref >=60)
Globulin: 2.1 g/dL (calc) (ref 1.9–3.7)
Glucose, Bld: 86 mg/dL (ref 65–99)
Potassium: 4.5 mmol/L (ref 3.5–5.3)
Sodium: 135 mmol/L (ref 135–146)
Total Bilirubin: 0.5 mg/dL (ref 0.2–1.2)
Total Protein: 6.3 g/dL (ref 6.1–8.1)

## 2019-12-08 LAB — CBC WITH DIFFERENTIAL/PLATELET
Absolute Monocytes: 624 cells/uL (ref 200–950)
Basophils Absolute: 38 cells/uL (ref 0–200)
Basophils Relative: 0.6 %
Eosinophils Absolute: 189 cells/uL (ref 15–500)
Eosinophils Relative: 3 %
HCT: 40.7 % (ref 35.0–45.0)
Hemoglobin: 13.7 g/dL (ref 11.7–15.5)
Lymphs Abs: 2054 cells/uL (ref 850–3900)
MCH: 32.2 pg (ref 27.0–33.0)
MCHC: 33.7 g/dL (ref 32.0–36.0)
MCV: 95.5 fL (ref 80.0–100.0)
MPV: 10.6 fL (ref 7.5–12.5)
Monocytes Relative: 9.9 %
Neutro Abs: 3396 cells/uL (ref 1500–7800)
Neutrophils Relative %: 53.9 %
Platelets: 237 10*3/uL (ref 140–400)
RBC: 4.26 10*6/uL (ref 3.80–5.10)
RDW: 12 % (ref 11.0–15.0)
Total Lymphocyte: 32.6 %
WBC: 6.3 10*3/uL (ref 3.8–10.8)

## 2019-12-08 LAB — LIPID PANEL
Cholesterol: 211 mg/dL — ABNORMAL HIGH (ref <200)
HDL: 63 mg/dL (ref >=50)
LDL Cholesterol (Calc): 120 mg/dL (calc) — ABNORMAL HIGH
Non-HDL Cholesterol (Calc): 148 mg/dL (calc) — ABNORMAL HIGH (ref <130)
Total CHOL/HDL Ratio: 3.3 (calc) (ref <5.0)
Triglycerides: 164 mg/dL — ABNORMAL HIGH (ref <150)

## 2019-12-08 LAB — TSH: TSH: 2.74 mIU/L (ref 0.40–4.50)

## 2019-12-09 ENCOUNTER — Encounter: Payer: Self-pay | Admitting: *Deleted

## 2019-12-09 DIAGNOSIS — H25813 Combined forms of age-related cataract, bilateral: Secondary | ICD-10-CM | POA: Diagnosis not present

## 2019-12-17 ENCOUNTER — Encounter: Payer: Self-pay | Admitting: Family

## 2019-12-17 ENCOUNTER — Other Ambulatory Visit: Payer: Self-pay

## 2019-12-17 ENCOUNTER — Ambulatory Visit (INDEPENDENT_AMBULATORY_CARE_PROVIDER_SITE_OTHER): Payer: Medicare Other | Admitting: Family

## 2019-12-17 VITALS — BP 120/70 | HR 80 | Temp 98.3°F | Ht 63.0 in | Wt 153.0 lb

## 2019-12-17 DIAGNOSIS — M7918 Myalgia, other site: Secondary | ICD-10-CM | POA: Diagnosis not present

## 2019-12-17 DIAGNOSIS — E782 Mixed hyperlipidemia: Secondary | ICD-10-CM | POA: Diagnosis not present

## 2019-12-17 DIAGNOSIS — Z6827 Body mass index (BMI) 27.0-27.9, adult: Secondary | ICD-10-CM

## 2019-12-17 DIAGNOSIS — Z1231 Encounter for screening mammogram for malignant neoplasm of breast: Secondary | ICD-10-CM

## 2019-12-17 DIAGNOSIS — E663 Overweight: Secondary | ICD-10-CM | POA: Diagnosis not present

## 2019-12-17 DIAGNOSIS — I10 Essential (primary) hypertension: Secondary | ICD-10-CM | POA: Diagnosis not present

## 2019-12-17 NOTE — Progress Notes (Signed)
Provider: [name] FNP-C   Ngetich, Nelda Bucks, NP  Patient Care Team: Ngetich, Nelda Bucks, NP as PCP - General (Family Medicine) Alda Berthold, DO as Consulting Physician (Neurology)  Extended Emergency Contact Information Primary Emergency Contact: Ketty, Bitton Mobile Phone: [phone] Relation: Spouse  Code Status:  Full Code  Goals of care: Advanced Directive information Advanced Directives 12/17/2019  Does Patient Have a Medical Advance Directive? Yes  Type of Paramedic of [place];Living will  Does patient want to make changes to medical advance directive? No - Patient declined  Copy of [place] in Chart? No - copy requested  Pre-existing out of facility DNR order (yellow form or pink MOST form) -     Chief Complaint  Patient presents with  . Medical Management of Chronic Issues    [facility] follow-up    HPI:  Pt is a 73y.o. female seen today for  6 months follow up for medical management of chronic diseases.she denies any acute issues today.she states husband died [id number] states coping well.Hospice service has been supportive.[place] She states continues to have right side muscle pain.Pain is chronic for several years.takes aleve as needed.pain worst with stretching to the left side.   Carpal Tunnel syndrome - she was seen by Neurologist Dr.Patel DArvin Collard for right hand paresthesia 10/12/.2020 right arm NCS/EMG ordered and recommended to use wrist splint at night.she was also advised to cut down wine consummation she was drinking 3 glasses nightly.she states cancelled follow up appointment with Neurologist. She has used wrist splint which has helped with her symptoms.   Hypertension - No home blood pressure readings for review.On Lisinopril 20 mg tablet daily.she denies any signs of hypotension.States  her diet changed when the husband was sick had to grap any simple meals.she will try to restart her healthy diet.   Hyperlipidemia - lab work reviewed LDL 120,TRG 164 and Chol 211 still high though slightly improved from previous labs though TRG is higher. Diet changes as above. On Flaxseed oil 1000 mg capsule daily.  [facility] [place] [place] bilateral eye cataract.she denies any changes in vision.   Health Maintenance She is due for Prevnar 13 and Pneumococcal vaccine but states does not take vaccines.Also states won't be getting the COVID-19 vaccine.  Her mammogram on chart review indicates due 202-18-2022but states due to fibrocystic breast was advised to get mammogram annually.will need referral today.    Past Medical History:  Diagnosis Date  . High blood pressure   . Hyperlipidemia    Per previous Cardiology Record   . Impaired fasting glucose    Per previous Cardiology records   . Macrocytosis 04/02/2016   Per previous Cardiology records   . Osteopenia 04/02/2016   Per previous Cardiology records    Past Surgical History:  Procedure Laterality Date  . APPENDECTOMY    . APPENDECTOMY  1977  . BREAST BIOPSY  03/07/2014   Benign  . BUNIONECTOMY  [id number] . COLONOSCOPY  10/07/2012   Per previous Cardiology records   . TONSILLECTOMY AND ADENOIDECTOMY  10/08/1955   Per previous Cardiology records     Allergies  Allergen Reactions  . Latex     Allergies as of 12/17/2019      Reactions   Latex  Medication List       Accurate as of December 17, 2019 10:03 AM. If you have any questions, ask your nurse or doctor.        CENTRUM ADULTS PO Take by mouth daily.   Flaxseed Oil 1000 MG Caps Take by mouth daily.   lisinopril 20 MG tablet Commonly known as: ZESTRIL Take 1 tablet (20 mg total) by mouth daily.   Lysine HCl 1000 MG Tabs Take by mouth daily.   naproxen sodium 220 MG  tablet Commonly known as: ALEVE Take 220 mg by mouth as needed.   valACYclovir 1000 MG tablet Commonly known as: Valtrex Take 2 tablets (2,000 mg total) by mouth as needed (for cold sore).       Review of Systems  Constitutional: Negative for appetite change, chills, fatigue and fever.  HENT: Negative for congestion, dental problem, hearing loss, postnasal drip, rhinorrhea, sinus pressure, sinus pain, sneezing and sore throat.   Eyes: Positive for visual disturbance. Negative for pain, discharge, redness and itching.       Follows up with Ophthalmology for bilateral cataract   Respiratory: Negative for cough, chest tightness, shortness of breath and wheezing.   Cardiovascular: Negative for chest pain, palpitations and leg swelling.  Gastrointestinal: Negative for abdominal distention, abdominal pain, constipation, diarrhea and nausea.  Endocrine: Negative for cold intolerance, heat intolerance, polydipsia, polyphagia and polyuria.  Genitourinary: Negative for decreased urine volume, difficulty urinating, dysuria, flank pain, frequency, urgency and vaginal discharge.  Musculoskeletal: Negative for arthralgias, gait problem and joint swelling.  Skin: Negative for color change, pallor and rash.  Neurological: Negative for dizziness, speech difficulty, weakness, light-headedness and headaches.       Numbness of right hand at bedtime.Also left hand sometimes.   Psychiatric/Behavioral: Negative for agitation, behavioral problems and sleep disturbance. The patient is not nervous/anxious.     Immunization History  Administered Date(s) Administered  . Hepatitis A 04/26/2004, 12/14/2004  . Hepatitis B, adult 10/07/2005  . IPV 07/15/2008  . Td 07/21/1976, 02/03/2004  . Tdap 07/15/2008  . Tetanus 07/22/2018  . Yellow Fever 02/03/2004   Pertinent  Health Maintenance Due  Topic Date Due  . PNA vac Low Risk Adult (1 of 2 - PCV13) Never done  . MAMMOGRAM  03/28/2021  . COLONOSCOPY   03/11/2022  . DEXA SCAN  Completed  . INFLUENZA VACCINE  Discontinued   Fall Risk  12/17/2019 07/19/2019 06/15/2019 02/10/2019 09/08/2018  Falls in the past year? 0 0 0 0 0  Number falls in past yr: 0 0 0 0 -  Injury with Fall? 0 0 0 0 -  Comment - - - - -    Vitals:   12/17/19 0955  BP: 120/70  Pulse: 80  Temp: 98.3 F (36.8 C)  TempSrc: Oral  Weight: 153 lb (69.4 kg)  Height: 5' 3" (1.6 m)   Body mass index is 27.1 kg/m. Physical Exam Vitals reviewed.  Constitutional:      General: She is not in acute distress.    Appearance: She is overweight. She is not ill-appearing.  HENT:     Head: Normocephalic.     Right Ear: Tympanic membrane, ear canal and external ear normal. There is no impacted cerumen.     Left Ear: Tympanic membrane, ear canal and external ear normal. There is no impacted cerumen.     Nose: Nose normal. No congestion or rhinorrhea.     Mouth/Throat:     Mouth: Mucous membranes are moist.  Pharynx: Oropharynx is clear. No oropharyngeal exudate or posterior oropharyngeal erythema.  Eyes:     General: No scleral icterus.       Right eye: No discharge.        Left eye: No discharge.     Extraocular Movements: Extraocular movements intact.     Conjunctiva/sclera: Conjunctivae normal.     Pupils: Pupils are equal, round, and reactive to light.     Comments: Corrective lens in place   Neck:     Vascular: No carotid bruit.  Cardiovascular:     Rate and Rhythm: Normal rate and regular rhythm.     Pulses: Normal pulses.     Heart sounds: Normal heart sounds. No murmur. No friction rub. No gallop.   Pulmonary:     Effort: Pulmonary effort is normal. No respiratory distress.     Breath sounds: Normal breath sounds. No wheezing or rales.  Chest:     Chest wall: No tenderness.  Abdominal:     General: Bowel sounds are normal. There is no distension.     Palpations: Abdomen is soft. There is no mass.     Tenderness: There is no abdominal tenderness. There is no  right CVA tenderness, left CVA tenderness, guarding or rebound.  Musculoskeletal:        General: No swelling, tenderness or deformity. Normal range of motion.     Cervical back: Normal range of motion. No rigidity or tenderness.     Right lower leg: No edema.     Left lower leg: No edema.  Lymphadenopathy:     Cervical: No cervical adenopathy.  Skin:    General: Skin is warm and dry.     Coloration: Skin is not pale.     Findings: No bruising, erythema or rash.  Neurological:     Mental Status: She is alert and oriented to person, place, and time.     Cranial Nerves: No cranial nerve deficit.     Sensory: No sensory deficit.     Motor: No weakness.     Coordination: Coordination normal.     Gait: Gait normal.  Psychiatric:        Mood and Affect: Mood is not anxious or depressed. Affect is tearful.        Speech: Speech normal.        Behavior: Behavior normal.        Thought Content: Thought content normal.        Judgment: Judgment normal.     Comments: Tearful talking about husband's death.     Labs reviewed: Recent Labs    02/17/19 0837 06/08/19 0833 12/08/19 0833  NA 138 138 135  K 4.5 4.4 4.5  CL 99 100 99  CO2 _0 GLUCOSE 88 103* 86  BUN _1 CREATININE 0.63 0.86 0.75  CALCIUM 10.4 9.6 9.7   Recent Labs    02/17/19 0837 06/08/19 0833 12/08/19 0833  AST _2 ALT _3 BILITOT 0.7 0.7 0.5  PROT 7.1 6.4 6.3   Recent Labs    02/17/19 0837 06/08/19 0833 12/08/19 0833  WBC 4.9 6.2 6.3  NEUTROABS 2,793 3,398 3,396  HGB 14.5 14.4 13.7  HCT 42.0 43.3 40.7  MCV 97.4 99.5 95.5  PLT 174 198 237   Lab Results  Component Value Date   TSH 2.74 12/08/2019   Lab Results  Component Value Date   HGBA1C 5.3 06/08/2019   Lab Results  Component Value Date   CHOL 211 (H) 12/08/2019   HDL 63 12/08/2019   LDLCALC 120 (H) 12/08/2019   TRIG 164 (H) 12/08/2019   CHOLHDL 3.3 12/08/2019    Significant Diagnostic Results in last 30 days:   No results found.  Assessment/Plan 1. Essential hypertension B/p controlled.continue on Lisinopril 20 mg tablet daily - CBC with Differential/Platelet; Future - CMP with eGFR([facility]); Future - TSH; Future  2. Mixed hyperlipidemia LDL not at goal.will get back to her healthy diet. - Lipid panel; Future  3. Musculoskeletal pain Chronic.No worsening symptoms.continue on Aleve 220 mg tablet as needed.   - Lipid panel; Future  4. Breast cancer screening by mammogram Hx of bilateral fibrocystic breast  - MM Digital Screening; Future  5. Overweight  BMI 27.1 has gain weight since last visit though has been eating fast food since she was caring for the husband.will try to get back to her heart healthy diet.   6.BMI 27.1 Dietary modification and exercise .   Family/ staff Communication: Reviewed plan of care with patient verbalized understanding.   Labs/tests ordered:  - CBC with Differential/Platelet; Future - CMP with eGFR([facility]); Future - TSH; Future - Lipid panel; Future  Next Appointment : 6 months for medical management of chronic issues.Fasting labs 2-4 days prior to visit.   Sandrea Hughs, NP

## 2019-12-17 NOTE — Patient Instructions (Signed)
-   continue on current medication  

## 2020-03-12 ENCOUNTER — Other Ambulatory Visit: Payer: Self-pay | Admitting: Family

## 2020-03-29 ENCOUNTER — Ambulatory Visit
Admission: RE | Admit: 2020-03-29 | Discharge: 2020-03-29 | Disposition: A | Discharge status: Home / Self Care | Payer: Medicare Other | Source: Ambulatory Visit | Attending: Family | Admitting: Family

## 2020-03-29 ENCOUNTER — Other Ambulatory Visit: Payer: Self-pay

## 2020-03-29 DIAGNOSIS — Z1231 Encounter for screening mammogram for malignant neoplasm of breast: Secondary | ICD-10-CM

## 2020-06-09 ENCOUNTER — Ambulatory Visit (INDEPENDENT_AMBULATORY_CARE_PROVIDER_SITE_OTHER): Payer: Medicare Other | Admitting: Family

## 2020-06-09 ENCOUNTER — Telehealth: Payer: Self-pay

## 2020-06-09 ENCOUNTER — Encounter: Payer: Self-pay | Admitting: Family

## 2020-06-09 ENCOUNTER — Other Ambulatory Visit: Payer: Self-pay | Admitting: Family

## 2020-06-09 ENCOUNTER — Other Ambulatory Visit: Payer: Self-pay

## 2020-06-09 DIAGNOSIS — Z0000 Encounter for general adult medical examination without abnormal findings: Secondary | ICD-10-CM

## 2020-06-09 NOTE — Progress Notes (Signed)
    This service is provided via telemedicine  No vital signs collected/recorded due to the encounter was a telemedicine visit.   Location of patient (ex: home, work): Home.  Patient consents to a telephone visit: Yes.  Location of the provider (ex: office, home):  [facility].  Name of any referring provider: N/A  Names of all persons participating in the telemedicine service and their role in the encounter:  Patient, Laurie Decker, RMA, Ngetich, Dinah, NP.    Time spent on call: 8 minutes spent on the phone with Medical Assistant.   

## 2020-06-09 NOTE — Progress Notes (Signed)
Subjective:   Laurie Decker is a 74 y.o. female who presents for Medicare Annual (Subsequent) preventive examination.  Review of Systems     Cardiac Risk Factors include: advanced age (>11men, >63 women);hypertension;smoking/ tobacco exposure     Objective:    There were no vitals filed for this visit. There is no height or weight on file to calculate BMI.  Advanced Directives 06/09/2020 12/17/2019 07/19/2019 02/10/2019 09/08/2018 01/16/2018  Does Patient Have a Medical Advance Directive? No Yes Yes Yes Yes Yes  Type of Advance Directive - [place];Living will - Living will;Healthcare Power of [place];Out of facility DNR (pink MOST or yellow form) [place];Living will [place];Living will  Does patient want to make changes to medical advance directive? - No - Patient declined - No - Patient declined No - Patient declined No - Patient declined  Copy of [place] in Chart? - No - copy requested - No - copy requested No - copy requested No - copy requested  Would patient like information on creating a medical advance directive? No - Patient declined - - - - -  Pre-existing out of facility DNR order (yellow form or pink MOST form) - - - Yellow form placed in chart (order not valid for inpatient use) - -    Current Medications (verified) Outpatient Encounter Medications as of 06/09/2020  Medication Sig  . Flaxseed, Linseed, (FLAXSEED OIL) 1000 MG CAPS Take by mouth daily.  Marland Kitchen lisinopril (ZESTRIL) 20 MG tablet TAKE ONE TABLET BY MOUTH DAILY  . Lysine HCl 1000 MG TABS Take by mouth daily.  . Multiple Vitamins-Minerals (CENTRUM ADULTS PO) Take by mouth daily.  . naproxen sodium (ALEVE) 220 MG tablet Take 220 mg by mouth as needed.  . valACYclovir (VALTREX) 1000 MG tablet Take 2 tablets (2,000 mg total) by mouth as needed (for cold sore).   No facility-administered encounter medications on file as of 06/09/2020.     Allergies (verified) Latex   History: Past Medical History:  Diagnosis Date  . High blood pressure   . Hyperlipidemia    Per previous Cardiology Record   . Impaired fasting glucose    Per previous Cardiology records   . Macrocytosis 04/02/2016   Per previous Cardiology records   . Osteopenia 04/02/2016   Per previous Cardiology records    Past Surgical History:  Procedure Laterality Date  . APPENDECTOMY    . APPENDECTOMY  1977  . BREAST BIOPSY  03/07/2014   Benign  . BUNIONECTOMY  2000  . COLONOSCOPY  10/07/2012   Per previous Cardiology records   . TONSILLECTOMY AND ADENOIDECTOMY  10/08/1955   Per previous Cardiology records    Family History  Problem Relation Age of Onset  . Heart disease Mother   . Alcoholism Father   . Colon cancer Father        malignant tumor of colon    Social History   Socioeconomic History  . Marital status: Married    Spouse name: Not on file  . Number of children: 2  . Years of education: 26  . Highest education level: Not on file  Occupational History  . Occupation: retired  Tobacco Use  . Smoking status: Former Smoker    Packs/day: 3.00  . Smokeless tobacco: Never Used  . Tobacco comment: Quit in 86s  Vaping Use  . Vaping Use: Never used  Substance and Sexual Activity  . Alcohol use: Yes    Alcohol/week:  3.0 standard drinks    Types: 3 Standard drinks or equivalent per week    Comment: 3 glasses of wine daily  . Drug use: No  . Sexual activity: Not on file  Other Topics Concern  . Not on file  Social History Narrative   Diet: N/A      Caffeine: 1 cup of coffee daily      Married, if yes what year: 2014      Do you live in a house, apartment, assisted living, condo, trailer, ect: House, 1 stories, 2 persons       Pets: 1 dog       Current/Past profession: Nursing School       Exercise: Yes, daily   rigth handed      Living Will:  Yes   DNR: Yes   POA/HPOA: Yes       Functional Status:   Do you have  difficulty bathing or dressing yourself? No   Do you have difficulty preparing food or eating? No   Do you have difficulty managing your medications? No   Do you have difficulty managing your finances? No   Do you have difficulty affording your medications? No   Social Determinants of Health   Financial Resource Strain:   . Difficulty of Paying Living Expenses: Not on file  Food Insecurity:   . Worried About Charity fundraiser in the Last Year: Not on file  . Ran Out of Food in the Last Year: Not on file  Transportation Needs:   . Lack of Transportation (Medical): Not on file  . Lack of Transportation (Non-Medical): Not on file  Physical Activity:   . Days of Exercise per Week: Not on file  . Minutes of Exercise per Session: Not on file  Stress:   . Feeling of Stress : Not on file  Social Connections:   . Frequency of Communication with Friends and Family: Not on file  . Frequency of Social Gatherings with Friends and Family: Not on file  . Attends Religious Services: Not on file  . Active Member of Clubs or Organizations: Not on file  . Attends Archivist Meetings: Not on file  . Marital Status: Not on file    Tobacco Counseling Counseling given: Not Answered Comment: Quit in 20s   Clinical Intake:  Pre-visit preparation completed: No  Pain : No/denies pain     BMI - recorded: 27.1 Nutritional Status: BMI 25 -29 Overweight Nutritional Risks: None Diabetes: No  How often do you need to have someone help you when you read instructions, pamphlets, or other written materials from your doctor or pharmacy?: 1 - Never What is the last grade level you completed in school?: [place]  Interpreter Needed?: No  Information entered by :: [place] FNP-C   Activities of Daily Living In your present state of health, do you have any difficulty performing the following activities: 06/09/2020  Hearing? N  Vision? N  Difficulty concentrating or  making decisions? N  Walking or climbing stairs? N  Dressing or bathing? N  Doing errands, shopping? N  Preparing Food and eating ? N  Using the Toilet? N  In the past six months, have you accidently leaked urine? N  Do you have problems with loss of bowel control? N  Managing your Medications? N  Managing your Finances? N  Housekeeping or managing your Housekeeping? N  Some recent data might be hidden    Patient Care Team: Telesforo Brosnahan,  Nelda Bucks, NP as PCP - General (Family Medicine) Alda Berthold, DO as Consulting Physician (Neurology)  Indicate any recent Medical Services you may have received from other than [facility] providers in the past year (date may be approximate).     Assessment:   This is a routine wellness examination for [place].  Hearing/Vision screen  Hearing Screening   125Hz  250Hz  500Hz  1000Hz  2000Hz  3000Hz  4000Hz  6000Hz  8000Hz   Right ear:           Left ear:           Comments: No Hearing Concerns.   Vision Screening Comments: No Vision Concerns. Patient wears prescription glasses.  Dietary issues and exercise activities discussed: Current Exercise Habits: Home exercise routine, Type of exercise: Other - see comments (water aerobics), Time (Minutes): 60, Frequency (Times/Week): 6, Weekly Exercise (Minutes/Week): 360, Intensity: Intense, Exercise limited by: None identified  Goals    . Patient Stated     Patient will start doing water aerobics      Depression Screen PHQ 2/9 Scores 06/09/2020 12/17/2019 02/10/2019 09/08/2018 01/16/2018 10/17/2017  PHQ - 2 Score 0 0 0 0 0 0    Fall Risk Fall Risk  06/09/2020 12/17/2019 07/19/2019 06/15/2019 02/10/2019  Falls in the past year? 0 0 0 0 0  Number falls in past yr: 0 0 0 0 0  Injury with Fall? 0 0 0 0 0  Comment - - - - -    Any stairs in or around the home? Yes  If so, are there any without handrails? No  Home free of loose throw rugs in walkways, pet beds, electrical cords, etc? Yes  Adequate lighting in your home to  reduce risk of falls? Yes   ASSISTIVE DEVICES UTILIZED TO PREVENT FALLS:  Life alert? No  Use of a cane, walker or w/c? No  Grab bars in the bathroom? Yes  Shower chair or bench in shower? Yes  Elevated toilet seat or a handicapped toilet? Yes   TIMED UP AND GO:  Was the test performed? No .  Length of time to ambulate 10 feet: N/A  sec.   Gait steady and fast without use of assistive device  Cognitive Function: MMSE - Mini Mental State Exam 01/16/2018  Orientation to time 5  Orientation to Place 5  Registration 3  Attention/ Calculation 5  Recall 2  Language- name 2 objects 2  Language- repeat 1  Language- follow 3 step command 3  Language- read & follow direction 1  Write a sentence 1  Copy design 1  Total score 29     6CIT Screen 06/09/2020  What Year? 0 points  What month? 0 points  What time? 0 points  Count back from 20 0 points  Months in reverse 0 points  Repeat phrase 0 points  Total Score 0    Immunizations Immunization History  Administered Date(s) Administered  . Hepatitis A 04/26/2004, 12/14/2004  . Hepatitis B, adult 10/07/2005  . IPV 07/15/2008  . Td 07/21/1976, 02/03/2004  . Tdap 07/15/2008  . Tetanus 07/22/2018  . Yellow Fever 02/03/2004    TDAP status: Up to date Flu Vaccine status: Declined, Education has been provided regarding the importance of this vaccine but patient still declined. Advised may receive this vaccine at local pharmacy or Health Dept. Aware to provide a copy of the vaccination record if obtained from local pharmacy or Health Dept. Verbalized acceptance and understanding. Pneumococcal vaccine status: Declined,  Education has been provided regarding the  importance of this vaccine but patient still declined. Advised may receive this vaccine at local pharmacy or Health Dept. Aware to provide a copy of the vaccination record if obtained from local pharmacy or Health Dept. Verbalized acceptance and understanding.  Covid-19 vaccine  status: Declined, Education has been provided regarding the importance of this vaccine but patient still declined. Advised may receive this vaccine at local pharmacy or Health Dept.or vaccine clinic. Aware to provide a copy of the vaccination record if obtained from local pharmacy or Health Dept. Verbalized acceptance and understanding.  Qualifies for Shingles Vaccine? Yes   Zostavax completed No   Shingrix Completed?: No.    Education has been provided regarding the importance of this vaccine. Patient has been advised to call insurance company to determine out of pocket expense if they have not yet received this vaccine. Advised may also receive vaccine at local pharmacy or Health Dept. Verbalized acceptance and understanding.  Screening Tests Health Maintenance  Topic Date Due  . COVID-19 Vaccine (1) Never done  . Hepatitis C Screening  12/16/2020 (Originally 1946-01-24)  . PNA vac Low Risk Adult (1 of 2 - PCV13) 12/16/2020 (Originally 09/24/2011)  . COLONOSCOPY  03/11/2022  . MAMMOGRAM  03/29/2022  . TETANUS/TDAP  07/22/2028  . DEXA SCAN  Completed  . INFLUENZA VACCINE  Discontinued    Health Maintenance  Health Maintenance Due  Topic Date Due  . COVID-19 Vaccine (1) Never done    Colorectal cancer screening: Completed 03/11/2012. Repeat every 10 years Mammogram status: Completed 2021. Repeat every year Bone Density status: Completed 2016 . Results reflect: Bone density results: NORMAL. Repeat every 2 years.  Lung Cancer Screening: (Low Dose CT Chest recommended if Age 33-80 years, 30 pack-year currently smoking OR have quit w/in 15years.) does not qualify.   Lung Cancer Screening Referral: n/A  Additional Screening:  Hepatitis C Screening: does not qualify; Completed No  Vision Screening: Recommended annual ophthalmology exams for early detection of glaucoma and other disorders of the eye. Is the patient up to date with their annual eye exam?  Yes  Who is the provider or  what is the name of the office in which the patient attends annual eye exams? Dr.Shapiro  If pt is not established with a provider, would they like to be referred to a provider to establish care? No .   Dental Screening: Recommended annual dental exams for proper oral hygiene  Community Resource Referral / Chronic Care Management: CRR required this visit?  No   CCM required this visit?  No      Plan:    I have personally reviewed and noted the following in the patient's chart:   . Medical and social history . Use of alcohol, tobacco or illicit drugs  . Current medications and supplements . Functional ability and status . Nutritional status . Physical activity . Advanced directives . List of other physicians . Hospitalizations, surgeries, and ER visits in previous 12 months . Vitals . Screenings to include cognitive, depression, and falls . Referrals and appointments  In addition, I have reviewed and discussed with patient certain preventive protocols, quality metrics, and best practice recommendations. A written personalized care plan for preventive services as well as general preventive health recommendations were provided to patient.    Sandrea Hughs, NP   06/09/2020   Nurse Notes:Patient does not take vaccines

## 2020-06-09 NOTE — Telephone Encounter (Signed)
Ms. xian, apostol are scheduled for a virtual visit with your provider today.    Just as we do with appointments in the office, we must obtain your consent to participate.  Your consent will be active for this visit and any virtual visit you may have with one of our providers in the next 365 days.    If you have a MyChart account, I can also send a copy of this consent to you electronically.  All virtual visits are billed to your insurance company just like a traditional visit in the office.  As this is a virtual visit, video technology does not allow for your provider to perform a traditional examination.  This may limit your provider's ability to fully assess your condition.  If your provider identifies any concerns that need to be evaluated in person or the need to arrange testing such as labs, EKG, etc, we will make arrangements to do so.    Although advances in technology are sophisticated, we cannot ensure that it will always work on either your end or our end.  If the connection with a video visit is poor, we may have to switch to a telephone visit.  With either a video or telephone visit, we are not always able to ensure that we have a secure connection.   I need to obtain your verbal consent now.   Are you willing to proceed with your visit today?   Renay Crammer has provided verbal consent on 06/09/2020 for a virtual visit (video or telephone).   [place], [place] 06/09/2020  3:01 PM

## 2020-06-19 ENCOUNTER — Other Ambulatory Visit: Payer: Self-pay

## 2020-06-19 ENCOUNTER — Other Ambulatory Visit: Payer: Medicare Other

## 2020-06-19 DIAGNOSIS — E782 Mixed hyperlipidemia: Secondary | ICD-10-CM | POA: Diagnosis not present

## 2020-06-19 DIAGNOSIS — I10 Essential (primary) hypertension: Secondary | ICD-10-CM | POA: Diagnosis not present

## 2020-06-19 DIAGNOSIS — M7918 Myalgia, other site: Secondary | ICD-10-CM

## 2020-06-19 DIAGNOSIS — R739 Hyperglycemia, unspecified: Secondary | ICD-10-CM | POA: Diagnosis not present

## 2020-06-20 NOTE — Patient Instructions (Signed)
Laurie Decker , Thank you for taking time to come for your Medicare Wellness Visit. I appreciate your ongoing commitment to your health goals. Please review the following plan we discussed and let me know if I can assist you in the future.   Screening recommendations/referrals: Colonoscopy: Due 6/5/20213  Mammogram : Up to date  Bone Density: Up to date Recommended yearly ophthalmology/optometry visit for glaucoma screening and checkup Recommended yearly dental visit for hygiene and checkup  Vaccinations: Influenza vaccine : Declined  Pneumococcal vaccine: up to date  Tdap vaccine  Shingles vaccine   Advanced directives: No   Conditions/risks identified: Advance Age female > 21 yrs,Hypertension,Hx of smoking   Next appointment: 1 year    Preventive Care 25 Years and Older, Female Preventive care refers to lifestyle choices and visits with your health care provider that can promote health and wellness. What does preventive care include?  A yearly physical exam. This is also called an annual well check.  Dental exams once or twice a year.  Routine eye exams. Ask your health care provider how often you should have your eyes checked.  Personal lifestyle choices, including:  Daily care of your teeth and gums.  Regular physical activity.  Eating a healthy diet.  Avoiding tobacco and drug use.  Limiting alcohol use.  Practicing safe sex.  Taking low-dose aspirin every day.  Taking vitamin and mineral supplements as recommended by your health care provider. What happens during an annual well check? The services and screenings done by your health care provider during your annual well check will depend on your age, overall health, lifestyle risk factors, and family history of disease. Counseling  Your health care provider may ask you questions about your:  Alcohol use.  Tobacco use.  Drug use.  Emotional well-being.  Home and relationship well-being.  Sexual  activity.  Eating habits.  History of falls.  Memory and ability to understand (cognition).  Work and work Statistician.  Reproductive health. Screening  You may have the following tests or measurements:  Height, weight, and BMI.  Blood pressure.  Lipid and cholesterol levels. These may be checked every 5 years, or more frequently if you are over 62 years old.  Skin check.  Lung cancer screening. You may have this screening every year starting at age 76 if you have a 30-pack-year history of smoking and currently smoke or have quit within the past 15 years.  Fecal occult blood test (FOBT) of the stool. You may have this test every year starting at age 51.  Flexible sigmoidoscopy or colonoscopy. You may have a sigmoidoscopy every 5 years or a colonoscopy every 10 years starting at age 60.  Hepatitis C blood test.  Hepatitis B blood test.  Sexually transmitted disease (STD) testing.  Diabetes screening. This is done by checking your blood sugar (glucose) after you have not eaten for a while (fasting). You may have this done every 1-3 years.  Bone density scan. This is done to screen for osteoporosis. You may have this done starting at age 73.  Mammogram. This may be done every 1-2 years. Talk to your health care provider about how often you should have regular mammograms. Talk with your health care provider about your test results, treatment options, and if necessary, the need for more tests. Vaccines  Your health care provider may recommend certain vaccines, such as:  Influenza vaccine. This is recommended every year.  Tetanus, diphtheria, and acellular pertussis (Tdap, Td) vaccine. You may need a  Td booster every 10 years.  Zoster vaccine. You may need this after age 50.  Pneumococcal 13-valent conjugate (PCV13) vaccine. One dose is recommended after age 39.  Pneumococcal polysaccharide (PPSV23) vaccine. One dose is recommended after age 58. Talk to your health care  provider about which screenings and vaccines you need and how often you need them. This information is not intended to replace advice given to you by your health care provider. Make sure you discuss any questions you have with your health care provider. Document Released: 10/20/2015 Document Revised: 06/12/2016 Document Reviewed: 07/25/2015 Elsevier Interactive Patient Education  [address] Prevention in the Home Falls can cause injuries. They can happen to people of all ages. There are many things you can do to make your home safe and to help prevent falls. What can I do on the outside of my home?  Regularly fix the edges of walkways and driveways and fix any cracks.  Remove anything that might make you trip as you walk through a door, such as a raised step or threshold.  Trim any bushes or trees on the path to your home.  Use bright outdoor lighting.  Clear any walking paths of anything that might make someone trip, such as rocks or tools.  Regularly check to see if handrails are loose or broken. Make sure that both sides of any steps have handrails.  Any raised decks and porches should have guardrails on the edges.  Have any leaves, snow, or ice cleared regularly.  Use sand or salt on walking paths during winter.  Clean up any spills in your garage right away. This includes oil or grease spills. What can I do in the bathroom?  Use night lights.  Install grab bars by the toilet and in the tub and shower. Do not use towel bars as grab bars.  Use non-skid mats or decals in the tub or shower.  If you need to sit down in the shower, use a plastic, non-slip stool.  Keep the floor dry. Clean up any water that spills on the floor as soon as it happens.  Remove soap buildup in the tub or shower regularly.  Attach bath mats securely with double-sided non-slip rug tape.  Do not have throw rugs and other things on the floor that can make you trip. What can I do in  the bedroom?  Use night lights.  Make sure that you have a light by your bed that is easy to reach.  Do not use any sheets or blankets that are too big for your bed. They should not hang down onto the floor.  Have a firm chair that has side arms. You can use this for support while you get dressed.  Do not have throw rugs and other things on the floor that can make you trip. What can I do in the kitchen?  Clean up any spills right away.  Avoid walking on wet floors.  Keep items that you use a lot in easy-to-reach places.  If you need to reach something above you, use a strong step stool that has a grab bar.  Keep electrical cords out of the way.  Do not use floor polish or wax that makes floors slippery. If you must use wax, use non-skid floor wax.  Do not have throw rugs and other things on the floor that can make you trip. What can I do with my stairs?  Do not leave any items on the stairs.  Make sure that there are handrails on both sides of the stairs and use them. Fix handrails that are broken or loose. Make sure that handrails are as long as the stairways.  Check any carpeting to make sure that it is firmly attached to the stairs. Fix any carpet that is loose or worn.  Avoid having throw rugs at the top or bottom of the stairs. If you do have throw rugs, attach them to the floor with carpet tape.  Make sure that you have a light switch at the top of the stairs and the bottom of the stairs. If you do not have them, ask someone to add them for you. What else can I do to help prevent falls?  Wear shoes that:  Do not have high heels.  Have rubber bottoms.  Are comfortable and fit you well.  Are closed at the toe. Do not wear sandals.  If you use a stepladder:  Make sure that it is fully opened. Do not climb a closed stepladder.  Make sure that both sides of the stepladder are locked into place.  Ask someone to hold it for you, if possible.  Clearly mark and  make sure that you can see:  Any grab bars or handrails.  First and last steps.  Where the edge of each step is.  Use tools that help you move around (mobility aids) if they are needed. These include:  Canes.  Walkers.  Scooters.  Crutches.  Turn on the lights when you go into a dark area. Replace any light bulbs as soon as they burn out.  Set up your furniture so you have a clear path. Avoid moving your furniture around.  If any of your floors are uneven, fix them.  If there are any pets around you, be aware of where they are.  Review your medicines with your doctor. Some medicines can make you feel dizzy. This can increase your chance of falling. Ask your doctor what other things that you can do to help prevent falls. This information is not intended to replace advice given to you by your health care provider. Make sure you discuss any questions you have with your health care provider. Document Released: 07/20/2009 Document Revised: 02/29/2016 Document Reviewed: 10/28/2014 Elsevier Interactive Patient Education  2017 [employer].

## 2020-06-23 ENCOUNTER — Ambulatory Visit (INDEPENDENT_AMBULATORY_CARE_PROVIDER_SITE_OTHER): Payer: Medicare Other | Admitting: Family

## 2020-06-23 ENCOUNTER — Ambulatory Visit: Payer: Medicare Other | Admitting: Family

## 2020-06-23 ENCOUNTER — Other Ambulatory Visit: Payer: Self-pay

## 2020-06-23 ENCOUNTER — Encounter: Payer: Self-pay | Admitting: Family

## 2020-06-23 VITALS — BP 130/92 | HR 69 | Temp 96.8°F | Resp 16 | Ht 63.0 in | Wt 153.6 lb

## 2020-06-23 DIAGNOSIS — I10 Essential (primary) hypertension: Secondary | ICD-10-CM | POA: Diagnosis not present

## 2020-06-23 DIAGNOSIS — E782 Mixed hyperlipidemia: Secondary | ICD-10-CM

## 2020-06-23 DIAGNOSIS — M7918 Myalgia, other site: Secondary | ICD-10-CM

## 2020-06-23 DIAGNOSIS — E663 Overweight: Secondary | ICD-10-CM

## 2020-06-23 DIAGNOSIS — Z8619 Personal history of other infectious and parasitic diseases: Secondary | ICD-10-CM

## 2020-06-23 DIAGNOSIS — Z6827 Body mass index (BMI) 27.0-27.9, adult: Secondary | ICD-10-CM | POA: Diagnosis not present

## 2020-06-23 LAB — CBC WITH DIFFERENTIAL/PLATELET
Absolute Monocytes: 536 cells/uL (ref 200–950)
Basophils Absolute: 28 cells/uL (ref 0–200)
Basophils Relative: 0.7 %
Eosinophils Absolute: 180 cells/uL (ref 15–500)
Eosinophils Relative: 4.5 %
HCT: 39.9 % (ref 35.0–45.0)
Hemoglobin: 13.3 g/dL (ref 11.7–15.5)
Lymphs Abs: 1440 cells/uL (ref 850–3900)
MCH: 32.7 pg (ref 27.0–33.0)
MCHC: 33.3 g/dL (ref 32.0–36.0)
MCV: 98 fL (ref 80.0–100.0)
MPV: 10.6 fL (ref 7.5–12.5)
Monocytes Relative: 13.4 %
Neutro Abs: 1816 cells/uL (ref 1500–7800)
Neutrophils Relative %: 45.4 %
Platelets: 199 10*3/uL (ref 140–400)
RBC: 4.07 10*6/uL (ref 3.80–5.10)
RDW: 12.1 % (ref 11.0–15.0)
Total Lymphocyte: 36 %
WBC: 4 10*3/uL (ref 3.8–10.8)

## 2020-06-23 LAB — LIPID PANEL
Cholesterol: 191 mg/dL (ref <200)
HDL: 80 mg/dL (ref >=50)
LDL Cholesterol (Calc): 93 mg/dL (calc)
Non-HDL Cholesterol (Calc): 111 mg/dL (calc) (ref <130)
Total CHOL/HDL Ratio: 2.4 (calc) (ref <5.0)
Triglycerides: 88 mg/dL (ref <150)

## 2020-06-23 LAB — COMPLETE METABOLIC PANEL WITH GFR
AG Ratio: 2 (calc) (ref 1.0–2.5)
ALT: 13 U/L (ref 6–29)
AST: 16 U/L (ref 10–35)
Albumin: 4.1 g/dL (ref 3.6–5.1)
Alkaline phosphatase (APISO): 61 U/L (ref 37–153)
BUN: 17 mg/dL (ref 7–25)
CO2: 30 mmol/L (ref 20–32)
Calcium: 9.1 mg/dL (ref 8.6–10.4)
Chloride: 102 mmol/L (ref 98–110)
Creat: 0.75 mg/dL (ref 0.60–0.93)
GFR, Est African American: 92 mL/min/{1.73_m2} (ref >=60)
GFR, Est Non African American: 79 mL/min/{1.73_m2} (ref >=60)
Globulin: 2.1 g/dL (calc) (ref 1.9–3.7)
Glucose, Bld: 102 mg/dL — ABNORMAL HIGH (ref 65–99)
Potassium: 4.5 mmol/L (ref 3.5–5.3)
Sodium: 138 mmol/L (ref 135–146)
Total Bilirubin: 0.5 mg/dL (ref 0.2–1.2)
Total Protein: 6.2 g/dL (ref 6.1–8.1)

## 2020-06-23 LAB — TSH: TSH: 2.44 mIU/L (ref 0.40–4.50)

## 2020-06-23 LAB — HEMOGLOBIN A1C
Hgb A1c MFr Bld: 5 % of total Hgb (ref <5.7)
Mean Plasma Glucose: 97 (calc)
eAG (mmol/L): 5.4 (calc)

## 2020-06-23 LAB — TEST AUTHORIZATION

## 2020-06-23 NOTE — Progress Notes (Signed)
Provider: [name] FNP-C   Laurie Decker, Laurie Bucks, NP  Patient Care Team: Laurie Decker, Laurie Bucks, NP as PCP - General (Family Medicine) Alda Berthold, DO as Consulting Physician (Neurology)  Extended Emergency Contact Information Primary Emergency Contact: NO, CONTACT Mobile Phone: [phone] Relation: None  Code Status:Full code  Goals of care: Advanced Directive information Advanced Directives 06/23/2020  Does Patient Have a Medical Advance Directive? Yes  Type of Advance Directive Living will  Does patient want to make changes to medical advance directive? No - Patient declined  Copy of [place] in Chart? -  Would patient like information on creating a medical advance directive? -  Pre-existing out of facility DNR order (yellow form or pink MOST form) -     Chief Complaint  Patient presents with   Medical Management of Chronic Issues    6 Month follow up   Immunizations    Discuss the need for Covid Vaccine.    HPI:  Pt is a 74 y.o. female seen today for 6 months for medical management of chronic diseases. She denies any acute issues today.states has been exercising by doing water aerobics but the pool was recently closed so she is doing Yoga at home until pool opens. Her recent lab work printed and discussed with her today. He previous high cholesterol,TRG, and LDL are now down to normal.congratulated for dietary modification and exercise.takes Flaxseed.1000 caps daily.  Checks her blood pressure at home has been normal no log for review.she continues to take her lisinopril 20 mg tablet daily.No signs of hypotension. She continues to use Aleve as needed for pain but has not required it. Has not required her valtrex either just uses as needed. Takes lysine daily.  She has had no weight loss or gain over 6 months.States feels great doesn't really get sick.   She is due for her Influenza vaccine and has not had COVID-19 vaccine but states cannot take any  form for vaccine because she has had a reaction to previous vaccine.Has to take prednisone    Past Medical History:  Diagnosis Date   High blood pressure    Hyperlipidemia    Per previous Cardiology Record    Impaired fasting glucose    Per previous Cardiology records    Macrocytosis 04/02/2016   Per previous Cardiology records    Osteopenia 04/02/2016   Per previous Cardiology records    Past Surgical History:  Procedure Laterality Date   APPENDECTOMY     APPENDECTOMY  1977   BREAST BIOPSY  03/07/2014   Benign   BUNIONECTOMY  2000   COLONOSCOPY  10/07/2012   Per previous Cardiology records    TONSILLECTOMY AND ADENOIDECTOMY  10/08/1955   Per previous Cardiology records     Allergies  Allergen Reactions   Latex     Allergies as of 06/23/2020      Reactions   Latex       Medication List       Accurate as of June 23, 2020 12:06 PM. If you have any questions, ask your nurse or doctor.        CENTRUM ADULTS PO Take by mouth daily.   Flaxseed Oil 1000 MG Caps Take by mouth daily.   lisinopril 20 MG tablet Commonly known as: ZESTRIL TAKE ONE TABLET BY MOUTH DAILY   Lysine HCl 1000 MG Tabs Take by mouth daily.   naproxen sodium 220 MG tablet Commonly known as: ALEVE Take 220 mg by mouth as needed.  valACYclovir 1000 MG tablet Commonly known as: Valtrex Take 2 tablets (2,000 mg total) by mouth as needed (for cold sore).       Review of Systems  Constitutional: Negative for appetite change, chills, fatigue and fever.  HENT: Negative for congestion, dental problem, hearing loss, rhinorrhea, sinus pressure, sinus pain, sneezing, sore throat and trouble swallowing.   Eyes: Positive for visual disturbance. Negative for discharge, redness and itching.       See Dr.Shapiro   Respiratory: Negative for cough, chest tightness, shortness of breath and wheezing.   Cardiovascular: Negative for chest pain, palpitations and leg swelling.    Gastrointestinal: Negative for abdominal distention, abdominal pain, constipation, diarrhea, nausea and vomiting.  Endocrine: Negative for cold intolerance, heat intolerance, polydipsia, polyphagia and polyuria.  Genitourinary: Negative for difficulty urinating, dysuria, flank pain, frequency and urgency.  Musculoskeletal: Negative for arthralgias, back pain, gait problem, joint swelling and myalgias.  Skin: Negative for color change, pallor and rash.  Neurological: Negative for dizziness, speech difficulty, weakness, light-headedness, numbness and headaches.  Hematological: Does not bruise/bleed easily.  Psychiatric/Behavioral: Negative for agitation, behavioral problems, confusion and sleep disturbance. The patient is not nervous/anxious.     Immunization History  Administered Date(s) Administered   Hepatitis A 04/26/2004, 12/14/2004   Hepatitis B, adult 10/07/2005   IPV 07/15/2008   Td 07/21/1976, 02/03/2004   Tdap 07/15/2008   Tetanus 07/22/2018   Yellow Fever 02/03/2004   Pertinent  Health Maintenance Due  Topic Date Due   PNA vac Low Risk Adult (1 of 2 - PCV13) 12/16/2020 (Originally 09/24/2011)   COLONOSCOPY  03/11/2022   MAMMOGRAM  03/29/2022   DEXA SCAN  Completed   INFLUENZA VACCINE  Discontinued   Fall Risk  06/23/2020 06/09/2020 12/17/2019 07/19/2019 06/15/2019  Falls in the past year? 0 0 0 0 0  Number falls in past yr: 0 0 0 0 0  Injury with Fall? 0 0 0 0 0  Comment - - - - -       Vitals:   06/23/20 1115  BP: (!) 130/92  Pulse: 69  Resp: 16  Temp: (!) 96.8 F (36 C)  SpO2: 98%  Weight: 153 lb 9.6 oz (69.7 kg)  Height: '5\' 3"'  (1.6 m)   Body mass index is 27.21 kg/m. Physical Exam Vitals reviewed.  Constitutional:      General: She is not in acute distress.    Appearance: She is overweight. She is not ill-appearing.  HENT:     Head: Normocephalic.     Right Ear: Tympanic membrane, ear canal and external ear normal. There is no impacted  cerumen.     Left Ear: Tympanic membrane, ear canal and external ear normal. There is no impacted cerumen.     Nose: Nose normal. No congestion or rhinorrhea.     Mouth/Throat:     Mouth: Mucous membranes are moist.     Pharynx: Oropharynx is clear. No oropharyngeal exudate or posterior oropharyngeal erythema.  Eyes:     General: No scleral icterus.       Right eye: No discharge.        Left eye: No discharge.     Extraocular Movements: Extraocular movements intact.     Conjunctiva/sclera: Conjunctivae normal.     Pupils: Pupils are equal, round, and reactive to light.  Neck:     Vascular: No carotid bruit.  Cardiovascular:     Rate and Rhythm: Normal rate and regular rhythm.     Pulses: Normal pulses.  Heart sounds: Normal heart sounds. No murmur heard.  No friction rub. No gallop.   Pulmonary:     Effort: Pulmonary effort is normal. No respiratory distress.     Breath sounds: Normal breath sounds. No wheezing, rhonchi or rales.  Chest:     Chest wall: No tenderness.  Abdominal:     General: Bowel sounds are normal. There is no distension.     Palpations: Abdomen is soft. There is no mass.     Tenderness: There is no abdominal tenderness. There is no right CVA tenderness, left CVA tenderness, guarding or rebound.  Musculoskeletal:        General: No swelling or tenderness. Normal range of motion.     Cervical back: Normal range of motion. No rigidity or tenderness.     Right lower leg: No edema.     Left lower leg: No edema.  Lymphadenopathy:     Cervical: No cervical adenopathy.  Skin:    General: Skin is warm and dry.     Coloration: Skin is not pale.     Findings: No bruising, erythema, lesion or rash.  Neurological:     Mental Status: She is alert and oriented to person, place, and time.     Cranial Nerves: No cranial nerve deficit.     Sensory: No sensory deficit.     Motor: No weakness.     Coordination: Coordination normal.     Gait: Gait normal.    Psychiatric:        Mood and Affect: Mood normal.        Behavior: Behavior normal.        Thought Content: Thought content normal.        Judgment: Judgment normal.     Labs reviewed: Recent Labs    12/08/19 0833 06/19/20 0822  NA 135 138  K 4.5 4.5  CL 99 102  CO2 27 30  GLUCOSE 86 102*  BUN 18 17  CREATININE 0.75 0.75  CALCIUM 9.7 9.1   Recent Labs    12/08/19 0833 06/19/20 0822  AST 18 16  ALT 14 13  BILITOT 0.5 0.5  PROT 6.3 6.2   Recent Labs    12/08/19 0833 06/19/20 0822  WBC 6.3 4.0  NEUTROABS 3,396 1,816  HGB 13.7 13.3  HCT 40.7 39.9  MCV 95.5 98.0  PLT 237 199   Lab Results  Component Value Date   TSH 2.44 06/19/2020   Lab Results  Component Value Date   HGBA1C 5.0 06/19/2020   Lab Results  Component Value Date   CHOL 191 06/19/2020   HDL 80 06/19/2020   LDLCALC 93 06/19/2020   TRIG 88 06/19/2020   CHOLHDL 2.4 06/19/2020    Significant Diagnostic Results in last 30 days:  No results found.  Assessment/Plan 1. Essential hypertension Diastolic slightly high today.B/p at home is within normal range. - continue with lisinopril 20 mg tablet daily  - not ASA  - CBC with Differential/Platelet; Future - TSH; Future - CMP with eGFR([facility]); Future  2. Mixed hyperlipidemia LDL at goal.congratulated for getting her numbers down compared to previous level.  - continue with dietary modification and exercise.  - Lipid panel; Future  3. Musculoskeletal pain Pain well control takes Aleve with food as needed  4. Overweight (BMI 25.0-29.9) Continues with water aerobics and yoga. Dietary modification advised.will reduce her carbohydrates intake.    5. Body mass index 27.0-27.9, adult BMI 27.21  Dietary and exercise as above  6.  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 - continue on Lysine 1000 mg daily and Valacyclovir 2000 mg as needed.   Family/ staff Communication: Reviewed plan of care with patient  Labs/tests ordered: None    Next Appointment :   Laurie Hughs, NP

## 2020-09-04 ENCOUNTER — Ambulatory Visit: Payer: Medicare Other | Admitting: Nurse Practitioner

## 2020-09-04 ENCOUNTER — Other Ambulatory Visit: Payer: Self-pay | Admitting: Family

## 2020-09-28 ENCOUNTER — Other Ambulatory Visit: Payer: Self-pay | Admitting: *Deleted

## 2020-09-28 DIAGNOSIS — Z8619 Personal history of other infectious and parasitic diseases: Secondary | ICD-10-CM

## 2020-09-28 MED ORDER — VALACYCLOVIR HCL 1 G PO TABS: 2000.0000 mg | ORAL_TABLET | ORAL | 2 refills | Status: DC | PRN

## 2020-09-28 NOTE — Telephone Encounter (Signed)
[facility] requested refill.  

## 2020-12-21 ENCOUNTER — Other Ambulatory Visit: Payer: Medicare Other

## 2020-12-21 ENCOUNTER — Other Ambulatory Visit: Payer: Self-pay

## 2020-12-21 DIAGNOSIS — E782 Mixed hyperlipidemia: Secondary | ICD-10-CM

## 2020-12-21 DIAGNOSIS — I10 Essential (primary) hypertension: Secondary | ICD-10-CM

## 2020-12-22 LAB — COMPLETE METABOLIC PANEL WITH GFR
AG Ratio: 2.1 (calc) (ref 1.0–2.5)
ALT: 12 U/L (ref 6–29)
AST: 17 U/L (ref 10–35)
Albumin: 4.2 g/dL (ref 3.6–5.1)
Alkaline phosphatase (APISO): 66 U/L (ref 37–153)
BUN: 21 mg/dL (ref 7–25)
CO2: 27 mmol/L (ref 20–32)
Calcium: 9.3 mg/dL (ref 8.6–10.4)
Chloride: 102 mmol/L (ref 98–110)
Creat: 0.79 mg/dL (ref 0.60–0.93)
GFR, Est African American: 85 mL/min/{1.73_m2} (ref >=60)
GFR, Est Non African American: 74 mL/min/{1.73_m2} (ref >=60)
Globulin: 2 g/dL (calc) (ref 1.9–3.7)
Glucose, Bld: 96 mg/dL (ref 65–99)
Potassium: 4.6 mmol/L (ref 3.5–5.3)
Sodium: 137 mmol/L (ref 135–146)
Total Bilirubin: 0.7 mg/dL (ref 0.2–1.2)
Total Protein: 6.2 g/dL (ref 6.1–8.1)

## 2020-12-22 LAB — CBC WITH DIFFERENTIAL/PLATELET
Absolute Monocytes: 447 cells/uL (ref 200–950)
Basophils Absolute: 39 cells/uL (ref 0–200)
Basophils Relative: 0.9 %
Eosinophils Absolute: 228 cells/uL (ref 15–500)
Eosinophils Relative: 5.3 %
HCT: 40.8 % (ref 35.0–45.0)
Hemoglobin: 13.7 g/dL (ref 11.7–15.5)
Lymphs Abs: 1488 cells/uL (ref 850–3900)
MCH: 32.6 pg (ref 27.0–33.0)
MCHC: 33.6 g/dL (ref 32.0–36.0)
MCV: 97.1 fL (ref 80.0–100.0)
MPV: 10.5 fL (ref 7.5–12.5)
Monocytes Relative: 10.4 %
Neutro Abs: 2098 cells/uL (ref 1500–7800)
Neutrophils Relative %: 48.8 %
Platelets: 204 10*3/uL (ref 140–400)
RBC: 4.2 10*6/uL (ref 3.80–5.10)
RDW: 12.3 % (ref 11.0–15.0)
Total Lymphocyte: 34.6 %
WBC: 4.3 10*3/uL (ref 3.8–10.8)

## 2020-12-22 LAB — LIPID PANEL
Cholesterol: 210 mg/dL — ABNORMAL HIGH (ref <200)
HDL: 72 mg/dL (ref >=50)
LDL Cholesterol (Calc): 116 mg/dL (calc) — ABNORMAL HIGH
Non-HDL Cholesterol (Calc): 138 mg/dL (calc) — ABNORMAL HIGH (ref <130)
Total CHOL/HDL Ratio: 2.9 (calc) (ref <5.0)
Triglycerides: 117 mg/dL (ref <150)

## 2020-12-22 LAB — TSH: TSH: 2.55 mIU/L (ref 0.40–4.50)

## 2020-12-26 ENCOUNTER — Ambulatory Visit (INDEPENDENT_AMBULATORY_CARE_PROVIDER_SITE_OTHER): Payer: Medicare Other | Admitting: Family

## 2020-12-26 ENCOUNTER — Encounter: Payer: Self-pay | Admitting: Family

## 2020-12-26 ENCOUNTER — Other Ambulatory Visit: Payer: Self-pay

## 2020-12-26 VITALS — BP 132/84 | HR 69 | Temp 96.9°F | Resp 18 | Ht 63.0 in | Wt 152.2 lb

## 2020-12-26 DIAGNOSIS — Z780 Asymptomatic menopausal state: Secondary | ICD-10-CM | POA: Diagnosis not present

## 2020-12-26 DIAGNOSIS — I10 Essential (primary) hypertension: Secondary | ICD-10-CM

## 2020-12-26 DIAGNOSIS — E782 Mixed hyperlipidemia: Secondary | ICD-10-CM | POA: Diagnosis not present

## 2020-12-26 DIAGNOSIS — Z1159 Encounter for screening for other viral diseases: Secondary | ICD-10-CM

## 2020-12-26 DIAGNOSIS — Z1231 Encounter for screening mammogram for malignant neoplasm of breast: Secondary | ICD-10-CM

## 2020-12-26 NOTE — Patient Instructions (Signed)

## 2020-12-26 NOTE — Progress Notes (Signed)
Provider: [name] FNP-C   Shelsie Tijerino, Nelda Bucks, NP  Patient Care Team: Johnathin Vanderschaaf, Nelda Bucks, NP as PCP - General (Family Medicine) Alda Berthold, DO as Consulting Physician (Neurology)  Extended Emergency Contact Information Primary Emergency Contact: NO, CONTACT Mobile Phone: [phone] Relation: None  Code Status: Full Code  Goals of care: Advanced Directive information Advanced Directives 12/26/2020  Does Patient Have a Medical Advance Directive? Yes  Type of Paramedic of [place];Living will  Does patient want to make changes to medical advance directive? No - Patient declined  Copy of [place] in Chart? No - copy requested  Would patient like information on creating a medical advance directive? -  Pre-existing out of facility DNR order (yellow form or pink MOST form) -     Chief Complaint  Patient presents with  . Medical Management of Chronic Issues    6 month follow-up, discuss labs (copy printed), and discuss need for Hep C screening. Patient refused covid and PNA recommendations.     HPI:  Pt is a 75 y.o. female seen today for 6 follow up for medical management of chronic diseases.She denies any acute issues today.States has been doing well.Her recent lab work done 12/21/2020 discussed and reviewed with patient today.copy of lab work  Hypertension - no home B/p readings for review but denies any symptoms of hypo/hypertension.she is on lisinopril 20 mg tablet daily.  Hyperlipidemia - Her recent lab work reviewed and discussed during visit.chol 210,HDL 72,TRG 117,LDL 116 on Flaxseed 1000 mg capsule daily.States includes veggies in her diet. Also does exercise twice a week for one hour.Her exercises includes total body cardio and walking on the Treadmill.  Has lost 2 lbs since last seen 06/23/2020.  She is due for mammogram,bone density and Hep C screen.she denies any breast pain or fall episode or fractures. Also no high risk  behaviors or history of blood transfusion.   She continues to require valtrex.uses it whenever she feels like symptoms recurs usually has a tingling sensation on the upper lip.symptoms usually goes away when she takes Valtrex.    Past Medical History:  Diagnosis Date  . High blood pressure   . Hyperlipidemia    Per previous Cardiology Record   . Impaired fasting glucose    Per previous Cardiology records   . Macrocytosis 04/02/2016   Per previous Cardiology records   . Osteopenia 04/02/2016   Per previous Cardiology records    Past Surgical History:  Procedure Laterality Date  . APPENDECTOMY    . APPENDECTOMY  1977  . BREAST BIOPSY  03/07/2014   Benign  . BUNIONECTOMY  2000  . COLONOSCOPY  10/07/2012   Per previous Cardiology records   . TONSILLECTOMY AND ADENOIDECTOMY  10/08/1955   Per previous Cardiology records     Allergies  Allergen Reactions  . Latex     Allergies as of 12/26/2020      Reactions   Latex       Medication List       Accurate as of December 26, 2020  1:08 PM. If you have any questions, ask your nurse or doctor.        CENTRUM ADULTS PO Take by mouth daily.   Flaxseed Oil 1000 MG Caps Take by mouth daily.   lisinopril 20 MG tablet Commonly known as: ZESTRIL TAKE ONE TABLET BY MOUTH DAILY   Lysine HCl 1000 MG Tabs Take by mouth daily.   naproxen sodium 220 MG tablet Commonly  known as: ALEVE Take 220 mg by mouth as needed.   valACYclovir 1000 MG tablet Commonly known as: Valtrex Take 2 tablets (2,000 mg total) by mouth as needed (for cold sore).       Review of Systems  Constitutional: Negative for appetite change, chills, fatigue and fever.  HENT: Negative for congestion, hearing loss, postnasal drip, rhinorrhea, sinus pressure, sinus pain, sneezing, sore throat and trouble swallowing.   Eyes: Positive for visual disturbance. Negative for pain, discharge, redness and itching.       Wears eye glasses follows up Dr.Shapiro    Respiratory: Negative for cough, chest tightness, shortness of breath and wheezing.   Cardiovascular: Negative for chest pain, palpitations and leg swelling.  Gastrointestinal: Negative for abdominal distention, abdominal pain, constipation, diarrhea, nausea and vomiting.  Endocrine: Negative for cold intolerance, heat intolerance, polydipsia, polyphagia and polyuria.  Genitourinary: Negative for difficulty urinating, dysuria, flank pain, frequency, urgency, vaginal bleeding and vaginal discharge.  Musculoskeletal: Negative for arthralgias, back pain, gait problem, neck pain and neck stiffness.  Skin: Negative for color change, pallor and rash.  Neurological: Negative for dizziness, tremors, speech difficulty, weakness, light-headedness, numbness and headaches.  Hematological: Does not bruise/bleed easily.  Psychiatric/Behavioral: Negative for agitation, behavioral problems, confusion, self-injury, sleep disturbance and suicidal ideas. The patient is not nervous/anxious.        Sleeps 8 hrs     Immunization History  Administered Date(s) Administered  . Hepatitis A 04/26/2004, 12/14/2004  . Hepatitis B, adult 10/07/2005  . IPV 07/15/2008  . Td 07/21/1976, 02/03/2004  . Tdap 07/15/2008  . Tetanus 07/22/2018  . Yellow Fever 02/03/2004   Pertinent  Health Maintenance Due  Topic Date Due  . PNA vac Low Risk Adult (1 of 2 - PCV13) 12/27/2027 (Originally 09/24/2011)  . COLONOSCOPY (Pts 45-71yr Insurance coverage will need to be confirmed)  03/11/2022  . MAMMOGRAM  03/29/2022  . DEXA SCAN  Completed  . INFLUENZA VACCINE  Discontinued   Fall Risk  12/26/2020 06/23/2020 06/09/2020 12/17/2019 07/19/2019  Falls in the past year? 0 0 0 0 0  Number falls in past yr: 0 0 0 0 0  Injury with Fall? 0 0 0 0 0  Comment - - - - -   Functional Status Survey:    Vitals:   12/26/20 1248  BP: 132/84  Pulse: 69  Resp: 18  Temp: (!) 96.9 F (36.1 C)  TempSrc: Temporal  SpO2: 95%  Weight: 152 lb  3.2 oz (69 kg)  Height: '5\' 3"'  (1.6 m)   Body mass index is 26.96 kg/m. Physical Exam Vitals reviewed.  Constitutional:      General: She is not in acute distress.    Appearance: She is not ill-appearing.  HENT:     Head: Normocephalic.     Right Ear: Tympanic membrane, ear canal and external ear normal. There is no impacted cerumen.     Left Ear: Tympanic membrane, ear canal and external ear normal. There is no impacted cerumen.     Nose: Nose normal. No congestion or rhinorrhea.     Mouth/Throat:     Mouth: Mucous membranes are moist.     Pharynx: Oropharynx is clear. No oropharyngeal exudate or posterior oropharyngeal erythema.  Eyes:     General: No scleral icterus.       Right eye: No discharge.        Left eye: No discharge.     Extraocular Movements: Extraocular movements intact.     Conjunctiva/sclera: Conjunctivae  normal.     Pupils: Pupils are equal, round, and reactive to light.  Neck:     Vascular: No carotid bruit.  Cardiovascular:     Rate and Rhythm: Normal rate and regular rhythm.     Pulses: Normal pulses.     Heart sounds: Normal heart sounds. No murmur heard. No friction rub. No gallop.   Pulmonary:     Effort: Pulmonary effort is normal. No respiratory distress.     Breath sounds: Normal breath sounds. No wheezing, rhonchi or rales.  Chest:     Chest wall: No tenderness.  Abdominal:     General: Bowel sounds are normal. There is no distension.     Palpations: Abdomen is soft. There is no mass.     Tenderness: There is no abdominal tenderness. There is no right CVA tenderness, left CVA tenderness, guarding or rebound.  Musculoskeletal:        General: No swelling or tenderness. Normal range of motion.     Cervical back: Normal range of motion. No rigidity or tenderness.     Right lower leg: No edema.     Left lower leg: No edema.  Lymphadenopathy:     Cervical: No cervical adenopathy.  Skin:    General: Skin is warm and dry.     Coloration: Skin is  not pale.     Findings: No bruising, erythema or rash.  Neurological:     Mental Status: She is alert and oriented to person, place, and time.     Cranial Nerves: No cranial nerve deficit.     Sensory: No sensory deficit.     Motor: No weakness.     Coordination: Coordination normal.     Gait: Gait normal.  Psychiatric:        Mood and Affect: Mood normal.        Behavior: Behavior normal.        Thought Content: Thought content normal.        Judgment: Judgment normal.     Labs reviewed: Recent Labs    06/19/20 0822 12/21/20 0806  NA 138 137  K 4.5 4.6  CL 102 102  CO2 30 27  GLUCOSE 102* 96  BUN 17 21  CREATININE 0.75 0.79  CALCIUM 9.1 9.3   Recent Labs    06/19/20 0822 12/21/20 0806  AST 16 17  ALT 13 12  BILITOT 0.5 0.7  PROT 6.2 6.2   Recent Labs    06/19/20 0822 12/21/20 0806  WBC 4.0 4.3  NEUTROABS 1,816 2,098  HGB 13.3 13.7  HCT 39.9 40.8  MCV 98.0 97.1  PLT 199 204   Lab Results  Component Value Date   TSH 2.55 12/21/2020   Lab Results  Component Value Date   HGBA1C 5.0 06/19/2020   Lab Results  Component Value Date   CHOL 210 (H) 12/21/2020   HDL 72 12/21/2020   LDLCALC 116 (H) 12/21/2020   TRIG 117 12/21/2020   CHOLHDL 2.9 12/21/2020    Significant Diagnostic Results in last 30 days:  No results found.  Assessment/Plan  1. Essential hypertension B/p well controlled. - continue on lisinopril 20 mg tablet daily. - not on ASA or Statin prefers natural Flaxseed.  - CBC with Differential/Platelet; Future - CMP with eGFR([facility]); Future - TSH; Future  2. Mixed hyperlipidemia LDL 116 - continue on dietary modification and exercise.Encouraged to increase exercise to at least 30 minutes three times per week.  - Lipid panel; Future  3.  Breast cancer screening by mammogram Asymptomatic. Made aware imaging center will call her for appointment.  - MM DIGITAL SCREENING BILATERAL; Future  4. Postmenopausal estrogen deficiency No  recent fall or fracture. - continue on MVI  - DG Bone Density; Future  5. Encounter for hepatitis C screening test for low risk patient No high risk behaviors or previous blood transfusion.  - Hep C Antibody; Future  Family/ staff Communication: Reviewed plan of care with patient verbalized understanding.   Labs/tests ordered:   - CBC with Differential/Platelet; Future - CMP with eGFR([facility]); Future - TSH; Future - Lipid panel; Future - Hep C Antibody; Future - MM DIGITAL SCREENING BILATERAL; Future - DG Bone Density; Future  Next Appointment : 6 months for medical management of chronic issues.Fasting Labs prior to visit.   Sandrea Hughs, NP

## 2021-01-05 ENCOUNTER — Other Ambulatory Visit: Payer: Self-pay | Admitting: Family

## 2021-01-05 DIAGNOSIS — Z1231 Encounter for screening mammogram for malignant neoplasm of breast: Secondary | ICD-10-CM

## 2021-01-05 DIAGNOSIS — Z780 Asymptomatic menopausal state: Secondary | ICD-10-CM

## 2021-03-05 ENCOUNTER — Other Ambulatory Visit: Payer: Self-pay | Admitting: Family

## 2021-03-30 ENCOUNTER — Ambulatory Visit: Payer: Medicare Other

## 2021-04-04 ENCOUNTER — Ambulatory Visit
Admission: RE | Admit: 2021-04-04 | Discharge: 2021-04-04 | Disposition: A | Discharge status: Home / Self Care | Payer: Medicare Other | Source: Ambulatory Visit | Attending: Family | Admitting: Family

## 2021-04-04 ENCOUNTER — Other Ambulatory Visit: Payer: Self-pay

## 2021-04-04 DIAGNOSIS — Z1231 Encounter for screening mammogram for malignant neoplasm of breast: Secondary | ICD-10-CM | POA: Diagnosis not present

## 2021-04-26 DIAGNOSIS — H25013 Cortical age-related cataract, bilateral: Secondary | ICD-10-CM | POA: Diagnosis not present

## 2021-04-26 DIAGNOSIS — H2513 Age-related nuclear cataract, bilateral: Secondary | ICD-10-CM | POA: Diagnosis not present

## 2021-05-14 ENCOUNTER — Telehealth (INDEPENDENT_AMBULATORY_CARE_PROVIDER_SITE_OTHER): Payer: Medicare Other | Admitting: Nurse Practitioner

## 2021-05-14 ENCOUNTER — Other Ambulatory Visit: Payer: Self-pay

## 2021-05-14 ENCOUNTER — Encounter: Payer: Self-pay | Admitting: Nurse Practitioner

## 2021-05-14 DIAGNOSIS — H1033 Unspecified acute conjunctivitis, bilateral: Secondary | ICD-10-CM

## 2021-05-14 MED ORDER — POLYMYXIN B-TRIMETHOPRIM 10000-0.1 UNIT/ML-% OP SOLN: 1.0000 [drp] | Freq: Four times a day (QID) | OPHTHALMIC | 0 refills | Status: DC

## 2021-05-14 NOTE — Progress Notes (Signed)
Careteam: Patient Care Team: Ngetich, Nelda Bucks, NP as PCP - General (Family Medicine) Alda Berthold, DO as Consulting Physician (Neurology)  Advanced Directive information    Allergies  Allergen Reactions   Latex     Chief Complaint  Patient presents with   Acute Visit    Patient c/o eye concerns x 3 days and questions if she has pink eye. Patient denies fever. Patient states she is recovering from a common cold. Visit performed via video visit.      HPI: Patient is a 75 y.o. female for red eyes for 3 days.  Reports she gets up a few times at night and washes her eyes with water. Eyes are crushy and matted shut.  Has used liquid tears to help with itching.  Using tea bags to get relief. Eyes are red and she is having a lot of yellow drainage.  No pain to her eyes.  Reports under eyes swollen. Very scratchy.  No changes in vision   Review of Systems:  Review of Systems  Eyes:  Positive for discharge and redness. Negative for blurred vision, double vision, photophobia and pain.   Past Medical History:  Diagnosis Date   High blood pressure    Hyperlipidemia    Per previous Cardiology Record    Impaired fasting glucose    Per previous Cardiology records    Macrocytosis 04/02/2016   Per previous Cardiology records    Osteopenia 04/02/2016   Per previous Cardiology records    Past Surgical History:  Procedure Laterality Date   APPENDECTOMY     APPENDECTOMY  1977   BREAST BIOPSY  03/07/2014   Benign   BUNIONECTOMY  2000   COLONOSCOPY  10/07/2012   Per previous Cardiology records    TONSILLECTOMY AND ADENOIDECTOMY  10/08/1955   Per previous Cardiology records    Social History:   reports that she has quit smoking. She smoked an average of 3 packs per day. She has never used smokeless tobacco. She reports current alcohol use of about 2.0 standard drinks of alcohol per week. She reports that she does not use drugs.  Family History  Problem Relation Age of  Onset   Heart disease Mother    Alcoholism Father    Colon cancer Father        malignant tumor of colon     Medications: Patient's Medications  New Prescriptions   No medications on file  Previous Medications   FLAXSEED, LINSEED, (FLAXSEED OIL) 1000 MG CAPS    Take by mouth daily.   LISINOPRIL (ZESTRIL) 20 MG TABLET    TAKE ONE TABLET BY MOUTH DAILY   LYSINE HCL 1000 MG TABS    Take by mouth daily.   MULTIPLE VITAMINS-MINERALS (CENTRUM ADULTS PO)    Take by mouth daily.   NAPROXEN SODIUM (ALEVE) 220 MG TABLET    Take 220 mg by mouth as needed.   VALACYCLOVIR (VALTREX) 1000 MG TABLET    Take 2 tablets (2,000 mg total) by mouth as needed (for cold sore).  Modified Medications   No medications on file  Discontinued Medications   No medications on file    Physical Exam:  There were no vitals filed for this visit. There is no height or weight on file to calculate BMI. Wt Readings from Last 3 Encounters:  12/26/20 152 lb 3.2 oz (69 kg)  06/23/20 153 lb 9.6 oz (69.7 kg)  12/17/19 153 lb (69.4 kg)      Physical  Exam Constitutional:      Appearance: Normal appearance.  HENT:     Head: Normocephalic and atraumatic.  Eyes:     Conjunctiva/sclera:     Right eye: Right conjunctiva is injected.     Left eye: Left conjunctiva is injected.  Neurological:     Mental Status: She is alert.  Limitd due to video visit.  Labs reviewed: Basic Metabolic Panel: Recent Labs    06/19/20 0822 12/21/20 0806  NA 138 137  K 4.5 4.6  CL 102 102  CO2 30 27  GLUCOSE 102* 96  BUN 17 21  CREATININE 0.75 0.79  CALCIUM 9.1 9.3  TSH 2.44 2.55   Liver Function Tests: Recent Labs    06/19/20 0822 12/21/20 0806  AST 16 17  ALT 13 12  BILITOT 0.5 0.7  PROT 6.2 6.2   No results for input(s): LIPASE, AMYLASE in the last 8760 hours. No results for input(s): AMMONIA in the last 8760 hours. CBC: Recent Labs    06/19/20 0822 12/21/20 0806  WBC 4.0 4.3  NEUTROABS 1,816 2,098  HGB  13.3 13.7  HCT 39.9 40.8  MCV 98.0 97.1  PLT 199 204   Lipid Panel: Recent Labs    06/19/20 0822 12/21/20 0806  CHOL 191 210*  HDL 80 72  LDLCALC 93 116*  TRIG 88 117  CHOLHDL 2.4 2.9   TSH: Recent Labs    06/19/20 0822 12/21/20 0806  TSH 2.44 2.55   A1C: Lab Results  Component Value Date   HGBA1C 5.0 06/19/2020     Assessment/Plan 1. Acute bacterial conjunctivitis of both eyes - trimethoprim-polymyxin b (POLYTRIM) ophthalmic solution; Place 1 drop into both eyes QID.  Dispense: 10 mL; Refill: 0 For 1 week.  -to use baby shampoo to clean eyelashes twice daily   Erroll Wilbourne K. Harle Battiest  [address] [facility] [phone]    Virtual Visit via mychart video  I connected with patient on 05/14/21 at  1:30 PM EDT by video and verified that I am speaking with the correct person using two identifiers.  Location: Patient: home Provider: home   I discussed the limitations, risks, security and privacy concerns of performing an evaluation and management service by telephone and the availability of in person appointments. I also discussed with the patient that there may be a patient responsible charge related to this service. The patient expressed understanding and agreed to proceed.   I discussed the assessment and treatment plan with the patient. The patient was provided an opportunity to ask questions and all were answered. The patient agreed with the plan and demonstrated an understanding of the instructions.   The patient was advised to call back or seek an in-person evaluation if the symptoms worsen or if the condition fails to improve as anticipated.  I provided 15 minutes of non-face-to-face time during this encounter.  Carlos American. Harle Battiest Avs printed and mailed

## 2021-05-14 NOTE — Progress Notes (Signed)
   This service is provided via telemedicine  No vital signs collected/recorded due to the encounter was a telemedicine visit.   Location of patient (ex: home, work):  Home  Patient consents to a telephone visit: Yes, see telephone visit dated 06/09/2020  Location of the provider (ex: office, home):  [facility], Office   Name of any referring provider:  N/A  Names of all persons participating in the telemedicine service and their role in the encounter:  S.Chrae B/CMA, Sherrie Mustache, NP, and Patient   Time spent on call:  5 min with medical assistant

## 2021-06-15 ENCOUNTER — Encounter: Payer: Self-pay | Admitting: Family

## 2021-06-15 ENCOUNTER — Ambulatory Visit (INDEPENDENT_AMBULATORY_CARE_PROVIDER_SITE_OTHER): Payer: Medicare Other | Admitting: Family

## 2021-06-15 ENCOUNTER — Other Ambulatory Visit: Payer: Self-pay

## 2021-06-15 DIAGNOSIS — Z0000 Encounter for general adult medical examination without abnormal findings: Secondary | ICD-10-CM | POA: Diagnosis not present

## 2021-06-15 NOTE — Progress Notes (Signed)
This service is provided via telemedicine  No vital signs collected/recorded due to the encounter was a telemedicine visit.   Location of patient (ex: home, work):  Home.  Patient consents to a telephone visit:  Yes  Location of the provider (ex: office, home):  [employer].  Name of any referring provider:  Hillary Schwegler, Nelda Bucks, NP   Names of all persons participating in the telemedicine service and their role in the encounter:  8 minutes spent on the phone with Medical Assistant.    Time spent on call: 8 minutes spent on the phone with Medical Assistant.     Subjective:   Laurie Decker is a 75 y.o. female who presents for Medicare Annual (Subsequent) preventive examination.  Review of Systems     Cardiac Risk Factors include: advanced age (>33mn, >>78women);smoking/ tobacco exposure;hypertension     Objective:    There were no vitals filed for this visit. There is no height or weight on file to calculate BMI.  Advanced Directives 06/15/2021 12/26/2020 06/23/2020 06/09/2020 12/17/2019 07/19/2019 02/10/2019  Does Patient Have a Medical Advance Directive? Yes Yes Yes No Yes Yes Yes  Type of Advance Directive Living will;Healthcare Power of [place]ake [place] will Living [place] - [place] - Living will;Healthcare Power of [place]Out of facility DNR (pink MOST or yellow form)  Does patient want to make changes to medical advance directive? No - Patient declined No - Patient declined No - Patient declined - No - Patient declined - No - Patient declined  Copy of [place] Chart? No - copy requested No - copy requested - - No - copy requested - No - copy requested  Would patient like information on creating a medical advance directive? No - Patient declined - - No - Patient declined - - -  Pre-existing out of facility DNR order (yellow form or pink MOST form) - - - - - - Yellow form placed in chart  (order not valid for inpatient use)    Current Medications (verified) Outpatient Encounter Medications as of 06/15/2021  Medication Sig   Flaxseed, Linseed, (FLAXSEED OIL) 1000 MG CAPS Take by mouth daily.   lisinopril (ZESTRIL) 20 MG tablet TAKE ONE TABLET BY MOUTH DAILY   Lysine HCl 1000 MG TABS Take by mouth daily.   Magnesium 100 MG CAPS Take 1 capsule by mouth daily.   Multiple Vitamins-Minerals (CENTRUM ADULTS PO) Take by mouth daily.   naproxen sodium (ALEVE) 220 MG tablet Take 220 mg by mouth as needed.   valACYclovir (VALTREX) 1000 MG tablet Take 2 tablets (2,000 mg total) by mouth as needed (for cold sore).   [DISCONTINUED] trimethoprim-polymyxin b (POLYTRIM) ophthalmic solution Place 1 drop into both eyes QID.   No facility-administered encounter medications on file as of 06/15/2021.    Allergies (verified) Latex   History: Past Medical History:  Diagnosis Date   High blood pressure    Hyperlipidemia    Per previous Cardiology Record    Impaired fasting glucose    Per previous Cardiology records    Macrocytosis 04/02/2016   Per previous Cardiology records    Osteopenia 04/02/2016   Per previous Cardiology records    Past Surgical History:  Procedure Laterality Date   APPENDECTOMY     APPENDECTOMY  1977   BREAST BIOPSY  03/07/2014   Benign   BUNIONECTOMY  2000   COLONOSCOPY  10/07/2012   Per previous Cardiology records  TONSILLECTOMY AND ADENOIDECTOMY  10/08/1955   Per previous Cardiology records    Family History  Problem Relation Age of Onset   Heart disease Mother    Alcoholism Father    Colon cancer Father        malignant tumor of colon    Social History   Socioeconomic History   Marital status: Married    Spouse name: Not on file   Number of children: 2   Years of education: 16   Highest education level: Not on file  Occupational History   Occupation: retired  Tobacco Use   Smoking status: Former    Packs/day: 3.00    Types: Cigarettes    Smokeless tobacco: Never   Tobacco comments:    Quit in 20s  Vaping Use   Vaping Use: Never used  Substance and Sexual Activity   Alcohol use: Yes    Alcohol/week: 2.0 standard drinks    Types: 2 Shots of liquor per week    Comment: 2 vodka shots  weekly   Drug use: No   Sexual activity: Not on file  Other Topics Concern   Not on file  Social History Narrative   Diet: N/A      Caffeine: 1 cup of coffee daily      Married, if yes what year: 2014      Do you live in a house, apartment, assisted living, condo, trailer, ect: House, 1 stories, 2 persons       Pets: 1 dog       Current/Past profession: Nursing School       Exercise: Yes, daily   rigth handed      Living Will:  Yes   DNR: Yes   POA/HPOA: Yes       Functional Status:   Do you have difficulty bathing or dressing yourself? No   Do you have difficulty preparing food or eating? No   Do you have difficulty managing your medications? No   Do you have difficulty managing your finances? No   Do you have difficulty affording your medications? No   Social Determinants of Radio broadcast assistant Strain: Not on file  Food Insecurity: Not on file  Transportation Needs: Not on file  Physical Activity: Not on file  Stress: Not on file  Social Connections: Not on file    Tobacco Counseling Counseling given: Not Answered Tobacco comments: Quit in 20s   Clinical Intake:  Pre-visit preparation completed: No  Pain : No/denies pain     BMI - recorded: 26.96 Nutritional Status: BMI 25 -29 Overweight Nutritional Risks: None Diabetes: No  How often do you need to have someone help you when you read instructions, pamphlets, or other written materials from your doctor or pharmacy?: 1 - Never What is the last grade level you completed in school?: Nursing 2 1/2 hrs  Diabetic?No   Interpreter Needed?: No  Information entered by :: Shalene Gallen,FNP-C   Activities of Daily Living In your present state  of health, do you have any difficulty performing the following activities: 06/15/2021  Hearing? N  Vision? N  Difficulty concentrating or making decisions? N  Walking or climbing stairs? N  Dressing or bathing? N  Doing errands, shopping? N  Preparing Food and eating ? N  Using the Toilet? N  In the past six months, have you accidently leaked urine? N  Do you have problems with loss of bowel control? N  Managing your Medications? N  Managing your  Finances? N  Housekeeping or managing your Housekeeping? N  Some recent data might be hidden    Patient Care Team: Ohana Birdwell, Nelda Bucks, NP as PCP - General (Family Medicine) Alda Berthold, DO as Consulting Physician (Neurology)  Indicate any recent Medical Services you may have received from other than [facility] providers in the past year (date may be approximate).     Assessment:   This is a routine wellness examination for [place].  Hearing/Vision screen Hearing Screening - Comments:: No Hearing Concerns. Patient doesn't wear hearing aids.  Vision Screening - Comments:: No Vision Concerns. Patient wears prescription glasses. Patient last eye exam was Aug 2022  Dietary issues and exercise activities discussed: Current Exercise Habits: The patient does not participate in regular exercise at present, Exercise limited by: None identified   Goals Addressed             This Visit's Progress    Patient Stated   Not on track    Patient will start doing water aerobics       Depression Screen PHQ 2/9 Scores 06/15/2021 12/26/2020 06/09/2020 12/17/2019 02/10/2019 09/08/2018 01/16/2018  PHQ - 2 Score 0 0 0 0 0 0 0    Fall Risk Fall Risk  06/15/2021 05/14/2021 12/26/2020 06/23/2020 06/09/2020  Falls in the past year? 0 0 0 0 0  Number falls in past yr: 0 0 0 0 0  Injury with Fall? 0 0 0 0 0  Comment - - - - -  Risk for fall due to : No Fall Risks No Fall Risks - - -  Follow up Falls evaluation completed Falls evaluation completed - - -    FALL RISK  PREVENTION PERTAINING TO THE HOME:  Any stairs in or around the home? Yes  If so, are there any without handrails? Yes  Home free of loose throw rugs in walkways, pet beds, electrical cords, etc? No  Adequate lighting in your home to reduce risk of falls? Yes   ASSISTIVE DEVICES UTILIZED TO PREVENT FALLS:  Life alert? No  Use of a cane, walker or w/c? No  Grab bars in the bathroom? Yes  Shower chair or bench in shower? No  Elevated toilet seat or a handicapped toilet? Yes   TIMED UP AND GO:  Was the test performed? No .  Length of time to ambulate 10 feet: N/A  sec.   Gait steady and fast without use of assistive device  Cognitive Function: MMSE - Mini Mental State Exam 01/16/2018  Orientation to time 5  Orientation to Place 5  Registration 3  Attention/ Calculation 5  Recall 2  Language- name 2 objects 2  Language- repeat 1  Language- follow 3 step command 3  Language- read & follow direction 1  Write a sentence 1  Copy design 1  Total score 29     6CIT Screen 06/15/2021 06/09/2020  What Year? 0 points 0 points  What month? 0 points 0 points  What time? 0 points 0 points  Count back from 20 0 points 0 points  Months in reverse 0 points 0 points  Repeat phrase 0 points 0 points  Total Score 0 0    Immunizations Immunization History  Administered Date(s) Administered   Hepatitis A 04/26/2004, 12/14/2004   Hepatitis B, adult 10/07/2005   IPV 07/15/2008   Td 07/21/1976, 02/03/2004   Tdap 07/15/2008   Tetanus 07/22/2018   Yellow Fever 02/03/2004    TDAP status: Up to date  Flu Vaccine  status: Declined, Education has been provided regarding the importance of this vaccine but patient still declined. Advised may receive this vaccine at local pharmacy or Health Dept. Aware to provide a copy of the vaccination record if obtained from local pharmacy or Health Dept. Verbalized acceptance and understanding.  Pneumococcal vaccine status: Declined,  Education has been  provided regarding the importance of this vaccine but patient still declined. Advised may receive this vaccine at local pharmacy or Health Dept. Aware to provide a copy of the vaccination record if obtained from local pharmacy or Health Dept. Verbalized acceptance and understanding.   Covid-19 vaccine status: Declined, Education has been provided regarding the importance of this vaccine but patient still declined. Advised may receive this vaccine at local pharmacy or Health Dept.or vaccine clinic. Aware to provide a copy of the vaccination record if obtained from local pharmacy or Health Dept. Verbalized acceptance and understanding.  Qualifies for Shingles Vaccine? Yes   Zostavax completed No   Shingrix Completed?: No.    Education has been provided regarding the importance of this vaccine. Patient has been advised to call insurance company to determine out of pocket expense if they have not yet received this vaccine. Advised may also receive vaccine at local pharmacy or Health Dept. Verbalized acceptance and understanding.  Screening Tests Health Maintenance  Topic Date Due   Hepatitis C Screening  Never done   Zoster Vaccines- Shingrix (1 of 2) Never done   COLONOSCOPY (Pts 45-13yr Insurance coverage will need to be confirmed)  03/11/2022   MAMMOGRAM  04/05/2023   TETANUS/TDAP  07/22/2028   DEXA SCAN  Completed   HPV VACCINES  Aged Out   INFLUENZA VACCINE  Discontinued   COVID-19 Vaccine  Discontinued   PNA vac Low Risk Adult  Discontinued    Health Maintenance  Health Maintenance Due  Topic Date Due   Hepatitis C Screening  Never done   Zoster Vaccines- Shingrix (1 of 2) Never done    Colorectal cancer screening: No longer required.   Mammogram status: Completed 04/04/2021. Repeat every year  Bone Density status: Ordered has appointment 06/20/2021 . Pt provided with contact info and advised to call to schedule appt.  Lung Cancer Screening: (Low Dose CT Chest recommended if Age  75-80years, 30 pack-year currently smoking OR have quit w/in 15years.) does not qualify.   Lung Cancer Screening Referral: No   Additional Screening:  Hepatitis C Screening: does not qualify; Completed No   Vision Screening: Recommended annual ophthalmology exams for early detection of glaucoma and other disorders of the eye. Is the patient up to date with their annual eye exam?  Yes  Who is the provider or what is the name of the office in which the patient attends annual eye exams? Dr.Shapiro  If pt is not established with a provider, would they like to be referred to a provider to establish care? No .   Dental Screening: Recommended annual dental exams for proper oral hygiene  Community Resource Referral / Chronic Care Management: CRR required this visit?  No   CCM required this visit?  No      Plan:     I have personally reviewed and noted the following in the patient's chart:   Medical and social history Use of alcohol, tobacco or illicit drugs  Current medications and supplements including opioid prescriptions.  Functional ability and status Nutritional status Physical activity Advanced directives List of other physicians Hospitalizations, surgeries, and ER visits in previous 12 months  Vitals Screenings to include cognitive, depression, and falls Referrals and appointments  In addition, I have reviewed and discussed with patient certain preventive protocols, quality metrics, and best practice recommendations. A written personalized care plan for preventive services as well as general preventive health recommendations were provided to patient.     Sandrea Hughs, NP   06/15/2021   Nurse Notes: - Declined all vaccines due to previous reactions.

## 2021-06-15 NOTE — Patient Instructions (Signed)
Laurie Decker , Thank you for taking time to come for your Medicare Wellness Visit. I appreciate your ongoing commitment to your health goals. Please review the following plan we discussed and let me know if I can assist you in the future.   Screening recommendations/referrals: Colonoscopy: N/A  Mammogram : Up to date  Bone Density  Up to date  Recommended yearly ophthalmology/optometry visit for glaucoma screening and checkup Recommended yearly dental visit for hygiene and checkup  Vaccinations: Influenza vaccine : declined  Pneumococcal vaccine : declined  Tdap vaccine: up to date  Shingles vaccine : declined    Advanced directives: yes   Conditions/risks identified: Advance age female > 40 yrs,hx of smoking and Hypertension   Next appointment: 1 year    Preventive Care 107 Years and Older, Female Preventive care refers to lifestyle choices and visits with your health care provider that can promote health and wellness. What does preventive care include? A yearly physical exam. This is also called an annual well check. Dental exams once or twice a year. Routine eye exams. Ask your health care provider how often you should have your eyes checked. Personal lifestyle choices, including: Daily care of your teeth and gums. Regular physical activity. Eating a healthy diet. Avoiding tobacco and drug use. Limiting alcohol use. Practicing safe sex. Taking low-dose aspirin every day. Taking vitamin and mineral supplements as recommended by your health care provider. What happens during an annual well check? The services and screenings done by your health care provider during your annual well check will depend on your age, overall health, lifestyle risk factors, and family history of disease. Counseling  Your health care provider may ask you questions about your: Alcohol use. Tobacco use. Drug use. Emotional well-being. Home and relationship well-being. Sexual activity. Eating  habits. History of falls. Memory and ability to understand (cognition). Work and work Statistician. Reproductive health. Screening  You may have the following tests or measurements: Height, weight, and BMI. Blood pressure. Lipid and cholesterol levels. These may be checked every 5 years, or more frequently if you are over 62 years old. Skin check. Lung cancer screening. You may have this screening every year starting at age 25 if you have a 30-pack-year history of smoking and currently smoke or have quit within the past 15 years. Fecal occult blood test (FOBT) of the stool. You may have this test every year starting at age 36. Flexible sigmoidoscopy or colonoscopy. You may have a sigmoidoscopy every 5 years or a colonoscopy every 10 years starting at age 78. Hepatitis C blood test. Hepatitis B blood test. Sexually transmitted disease (STD) testing. Diabetes screening. This is done by checking your blood sugar (glucose) after you have not eaten for a while (fasting). You may have this done every 1-3 years. Bone density scan. This is done to screen for osteoporosis. You may have this done starting at age 81. Mammogram. This may be done every 1-2 years. Talk to your health care provider about how often you should have regular mammograms. Talk with your health care provider about your test results, treatment options, and if necessary, the need for more tests. Vaccines  Your health care provider may recommend certain vaccines, such as: Influenza vaccine. This is recommended every year. Tetanus, diphtheria, and acellular pertussis (Tdap, Td) vaccine. You may need a Td booster every 10 years. Zoster vaccine. You may need this after age 90. Pneumococcal 13-valent conjugate (PCV13) vaccine. One dose is recommended after age 37. Pneumococcal polysaccharide (PPSV23) vaccine.  One dose is recommended after age 67. Talk to your health care provider about which screenings and vaccines you need and how  often you need them. This information is not intended to replace advice given to you by your health care provider. Make sure you discuss any questions you have with your health care provider. Document Released: 10/20/2015 Document Revised: 06/12/2016 Document Reviewed: 07/25/2015 Elsevier Interactive Patient Education  2017 [place] Prevention in the Home Falls can cause injuries. They can happen to people of all ages. There are many things you can do to make your home safe and to help prevent falls. What can I do on the outside of my home? Regularly fix the edges of walkways and driveways and fix any cracks. Remove anything that might make you trip as you walk through a door, such as a raised step or threshold. Trim any bushes or trees on the path to your home. Use bright outdoor lighting. Clear any walking paths of anything that might make someone trip, such as rocks or tools. Regularly check to see if handrails are loose or broken. Make sure that both sides of any steps have handrails. Any raised decks and porches should have guardrails on the edges. Have any leaves, snow, or ice cleared regularly. Use sand or salt on walking paths during winter. Clean up any spills in your garage right away. This includes oil or grease spills. What can I do in the bathroom? Use night lights. Install grab bars by the toilet and in the tub and shower. Do not use towel bars as grab bars. Use non-skid mats or decals in the tub or shower. If you need to sit down in the shower, use a plastic, non-slip stool. Keep the floor dry. Clean up any water that spills on the floor as soon as it happens. Remove soap buildup in the tub or shower regularly. Attach bath mats securely with double-sided non-slip rug tape. Do not have throw rugs and other things on the floor that can make you trip. What can I do in the bedroom? Use night lights. Make sure that you have a light by your bed that is easy to  reach. Do not use any sheets or blankets that are too big for your bed. They should not hang down onto the floor. Have a firm chair that has side arms. You can use this for support while you get dressed. Do not have throw rugs and other things on the floor that can make you trip. What can I do in the kitchen? Clean up any spills right away. Avoid walking on wet floors. Keep items that you use a lot in easy-to-reach places. If you need to reach something above you, use a strong step stool that has a grab bar. Keep electrical cords out of the way. Do not use floor polish or wax that makes floors slippery. If you must use wax, use non-skid floor wax. Do not have throw rugs and other things on the floor that can make you trip. What can I do with my stairs? Do not leave any items on the stairs. Make sure that there are handrails on both sides of the stairs and use them. Fix handrails that are broken or loose. Make sure that handrails are as long as the stairways. Check any carpeting to make sure that it is firmly attached to the stairs. Fix any carpet that is loose or worn. Avoid having throw rugs at the top or bottom of  the stairs. If you do have throw rugs, attach them to the floor with carpet tape. Make sure that you have a light switch at the top of the stairs and the bottom of the stairs. If you do not have them, ask someone to add them for you. What else can I do to help prevent falls? Wear shoes that: Do not have high heels. Have rubber bottoms. Are comfortable and fit you well. Are closed at the toe. Do not wear sandals. If you use a stepladder: Make sure that it is fully opened. Do not climb a closed stepladder. Make sure that both sides of the stepladder are locked into place. Ask someone to hold it for you, if possible. Clearly mark and make sure that you can see: Any grab bars or handrails. First and last steps. Where the edge of each step is. Use tools that help you move  around (mobility aids) if they are needed. These include: Canes. Walkers. Scooters. Crutches. Turn on the lights when you go into a dark area. Replace any light bulbs as soon as they burn out. Set up your furniture so you have a clear path. Avoid moving your furniture around. If any of your floors are uneven, fix them. If there are any pets around you, be aware of where they are. Review your medicines with your doctor. Some medicines can make you feel dizzy. This can increase your chance of falling. Ask your doctor what other things that you can do to help prevent falls. This information is not intended to replace advice given to you by your health care provider. Make sure you discuss any questions you have with your health care provider. Document Released: 07/20/2009 Document Revised: 02/29/2016 Document Reviewed: 10/28/2014 Elsevier Interactive Patient Education  2017 [employer].

## 2021-06-20 ENCOUNTER — Ambulatory Visit
Admission: RE | Admit: 2021-06-20 | Discharge: 2021-06-20 | Disposition: A | Discharge status: Home / Self Care | Payer: Medicare Other | Source: Ambulatory Visit | Attending: Family | Admitting: Family

## 2021-06-20 ENCOUNTER — Other Ambulatory Visit: Payer: Self-pay

## 2021-06-20 DIAGNOSIS — M8589 Other specified disorders of bone density and structure, multiple sites: Secondary | ICD-10-CM | POA: Diagnosis not present

## 2021-06-20 DIAGNOSIS — Z780 Asymptomatic menopausal state: Secondary | ICD-10-CM

## 2021-06-25 ENCOUNTER — Other Ambulatory Visit: Payer: Medicare Other

## 2021-06-25 ENCOUNTER — Other Ambulatory Visit: Payer: Self-pay

## 2021-06-25 ENCOUNTER — Telehealth: Payer: Self-pay | Admitting: *Deleted

## 2021-06-25 DIAGNOSIS — E782 Mixed hyperlipidemia: Secondary | ICD-10-CM

## 2021-06-25 DIAGNOSIS — Z1159 Encounter for screening for other viral diseases: Secondary | ICD-10-CM

## 2021-06-25 DIAGNOSIS — I10 Essential (primary) hypertension: Secondary | ICD-10-CM | POA: Diagnosis not present

## 2021-06-25 NOTE — Telephone Encounter (Signed)
Spoke with Amy and Janett Billow and they stated that Insurance will not cover and she would have to pay out of pocket.   Called and spoke with Patient and she said Never Mind with the lab to discard.   Lab Tech informed.

## 2021-06-25 NOTE — Telephone Encounter (Signed)
Patient here for labwork to be done and requested the labs to check the Antibodies for Covid.  Patient is not showing any symptoms, she just want to see if she has Antibodies and wants to know if you will place an order for her to have this done.

## 2021-06-26 LAB — COMPLETE METABOLIC PANEL WITH GFR
AG Ratio: 2.1 (calc) (ref 1.0–2.5)
ALT: 15 U/L (ref 6–29)
AST: 20 U/L (ref 10–35)
Albumin: 4.4 g/dL (ref 3.6–5.1)
Alkaline phosphatase (APISO): 68 U/L (ref 37–153)
BUN: 17 mg/dL (ref 7–25)
CO2: 30 mmol/L (ref 20–32)
Calcium: 9.6 mg/dL (ref 8.6–10.4)
Chloride: 103 mmol/L (ref 98–110)
Creat: 0.69 mg/dL (ref 0.60–1.00)
Globulin: 2.1 g/dL (calc) (ref 1.9–3.7)
Glucose, Bld: 94 mg/dL (ref 65–99)
Potassium: 4.4 mmol/L (ref 3.5–5.3)
Sodium: 140 mmol/L (ref 135–146)
Total Bilirubin: 0.6 mg/dL (ref 0.2–1.2)
Total Protein: 6.5 g/dL (ref 6.1–8.1)
eGFR: 91 mL/min/{1.73_m2} (ref >=60)

## 2021-06-26 LAB — CBC WITH DIFFERENTIAL/PLATELET
Absolute Monocytes: 534 cells/uL (ref 200–950)
Basophils Absolute: 41 cells/uL (ref 0–200)
Basophils Relative: 0.9 %
Eosinophils Absolute: 271 cells/uL (ref 15–500)
Eosinophils Relative: 5.9 %
HCT: 42.2 % (ref 35.0–45.0)
Hemoglobin: 14.1 g/dL (ref 11.7–15.5)
Lymphs Abs: 1665 cells/uL (ref 850–3900)
MCH: 32.8 pg (ref 27.0–33.0)
MCHC: 33.4 g/dL (ref 32.0–36.0)
MCV: 98.1 fL (ref 80.0–100.0)
MPV: 10.9 fL (ref 7.5–12.5)
Monocytes Relative: 11.6 %
Neutro Abs: 2088 cells/uL (ref 1500–7800)
Neutrophils Relative %: 45.4 %
Platelets: 175 10*3/uL (ref 140–400)
RBC: 4.3 10*6/uL (ref 3.80–5.10)
RDW: 12.2 % (ref 11.0–15.0)
Total Lymphocyte: 36.2 %
WBC: 4.6 10*3/uL (ref 3.8–10.8)

## 2021-06-26 LAB — LIPID PANEL
Cholesterol: 215 mg/dL — ABNORMAL HIGH (ref <200)
HDL: 80 mg/dL (ref >=50)
LDL Cholesterol (Calc): 111 mg/dL (calc) — ABNORMAL HIGH
Non-HDL Cholesterol (Calc): 135 mg/dL (calc) — ABNORMAL HIGH (ref <130)
Total CHOL/HDL Ratio: 2.7 (calc) (ref <5.0)
Triglycerides: 129 mg/dL (ref <150)

## 2021-06-26 LAB — TSH: TSH: 2.93 mIU/L (ref 0.40–4.50)

## 2021-06-26 LAB — HEPATITIS C ANTIBODY
Hepatitis C Ab: NONREACTIVE
SIGNAL TO CUT-OFF: 0.01 (ref <1.00)

## 2021-06-28 ENCOUNTER — Other Ambulatory Visit: Payer: Self-pay

## 2021-06-28 ENCOUNTER — Encounter: Payer: Self-pay | Admitting: Family

## 2021-06-28 ENCOUNTER — Ambulatory Visit (INDEPENDENT_AMBULATORY_CARE_PROVIDER_SITE_OTHER): Payer: Medicare Other | Admitting: Family

## 2021-06-28 VITALS — BP 128/88 | HR 70 | Temp 97.5°F | Resp 20 | Ht 63.0 in | Wt 158.6 lb

## 2021-06-28 DIAGNOSIS — Z6828 Body mass index (BMI) 28.0-28.9, adult: Secondary | ICD-10-CM | POA: Diagnosis not present

## 2021-06-28 DIAGNOSIS — I10 Essential (primary) hypertension: Secondary | ICD-10-CM

## 2021-06-28 DIAGNOSIS — E782 Mixed hyperlipidemia: Secondary | ICD-10-CM

## 2021-06-28 DIAGNOSIS — M85851 Other specified disorders of bone density and structure, right thigh: Secondary | ICD-10-CM | POA: Diagnosis not present

## 2021-06-28 DIAGNOSIS — E663 Overweight: Secondary | ICD-10-CM

## 2021-06-28 MED ORDER — CALCIUM CITRATE + D3 200-250 MG-UNIT PO TABS: 1.0000 | ORAL_TABLET | Freq: Every day | ORAL | 1 refills | Status: AC

## 2021-06-28 NOTE — Patient Instructions (Signed)
Osteopenia Osteopenia is a loss of thickness (density) inside the bones. Another name for osteopenia is low bone mass. Mild osteopenia is a normal part of aging. It is not a disease, and it does not cause symptoms. However, if you have osteopenia and continue to lose bone mass, you could develop a condition that causes the bones to become thin and break more easily (osteoporosis). Osteoporosis can cause you to lose some height, have back pain, and have a stooped posture. Although osteopenia is not a disease, making changes to your lifestyle and diet can help to prevent osteopenia from developing into osteoporosis. What are the causes? Osteopenia is caused by loss of calcium in the bones. Bones are constantly changing. Old bone cells are continually being replaced with new bone cells. This process builds new bone. The mineral calcium is needed to build new bone and maintain bone density. Bone density is usually highest around age 38. After that, most people's bodies cannot replace all the bone they have lost with new bone. What increases the risk? You are more likely to develop this condition if: You are older than age 73. You are a woman who went through menopause early. You have a long illness that keeps you in bed. You do not get enough exercise. You lack certain nutrients (malnutrition). You have an overactive thyroid gland (hyperthyroidism). You use products that contain nicotine or tobacco, such as cigarettes, e-cigarettes and chewing tobacco, or you drink a lot of alcohol. You are taking medicines that weaken the bones, such as steroids. What are the signs or symptoms? This condition does not cause any symptoms. You may have a slightly higher risk for bone breaks (fractures), so getting fractures more easily than normal may be an indication of osteopenia. How is this diagnosed? This condition may be diagnosed based on an X-ray exam that measures bone density (dual-energy X-ray  absorptiometry, or DEXA). This test can measure bone density in your hips, spine, and wrists. Osteopenia has no symptoms, so this condition is usually diagnosed after a routine bone density screening test is done for osteoporosis. This routine screening is usually done for: Women who are age 34 or older. Men who are age 6 or older. If you have risk factors for osteopenia, you may have the screening test at an earlier age. How is this treated? Making dietary and lifestyle changes can lower your risk for osteoporosis. If you have severe osteopenia that is close to becoming osteoporosis, this condition can be treated with medicines and dietary supplements such as calcium and vitamin D. These supplements help to rebuild bone density. Follow these instructions at home: Eating and drinking Eat a diet that is high in calcium and vitamin D. Calcium is found in dairy products, beans, salmon, and leafy green vegetables like spinach and broccoli. Look for foods that have vitamin D and calcium added to them (fortified foods), such as orange juice, cereal, and bread.  Lifestyle Do 30 minutes or more of a weight-bearing exercise every day, such as walking, jogging, or playing a sport. These types of exercises strengthen the bones. Do not use any products that contain nicotine or tobacco, such as cigarettes, e-cigarettes, and chewing tobacco. If you need help quitting, ask your health care provider. Do not drink alcohol if: Your health care provider tells you not to drink. You are pregnant, may be pregnant, or are planning to become pregnant. If you drink alcohol: Limit how much you use to: 0-1 drink a day for women. 0-2 drinks  a day for men. Be aware of how much alcohol is in your drink. In the [place] one drink equals one 12 oz bottle of beer (355 mL), one 5 oz glass of wine (148 mL), or one 1 oz glass of hard liquor (44 mL). General instructions Take over-the-counter and prescription medicines only as  told by your health care provider. These include vitamins and supplements. Take precautions at home to lower your risk of falling, such as: Keeping rooms well-lit and free of clutter, such as cords. Installing safety rails on stairs. Using rubber mats in the bathroom or other areas that are often wet or slippery. Keep all follow-up visits. This is important. Contact a health care provider if: You have not had a bone density screening for osteoporosis and you are: A woman who is age 92 or older. A man who is age 32 or older. You are a postmenopausal woman who has not had a bone density screening for osteoporosis. You are older than age 26 and you want to know if you should have bone density screening for osteoporosis. Summary Osteopenia is a loss of thickness (density) inside the bones. Another name for osteopenia is low bone mass. Osteopenia is not a disease, but it may increase your risk for a condition that causes the bones to become thin and break more easily (osteoporosis). You may be at risk for osteopenia if you are older than age 30 or if you are a woman who went through early menopause. Osteopenia does not cause any symptoms, but it can be diagnosed with a bone density screening test. Dietary and lifestyle changes are the first treatment for osteopenia. These may lower your risk for osteoporosis. This information is not intended to replace advice given to you by your health care provider. Make sure you discuss any questions you have with your health care provider. Document Revised: 03/09/2020 Document Reviewed: 03/09/2020 Elsevier Patient Education  [place].

## 2021-06-28 NOTE — Progress Notes (Signed)
Provider: [name] FNP-C   Thelma Lorenzetti, Nelda Bucks, NP  Patient Care Team: Tracee Mccreery, Nelda Bucks, NP as PCP - General (Family Medicine) Alda Berthold, DO as Consulting Physician (Neurology)  Extended Emergency Contact Information Primary Emergency Contact: Ritchie,Rob Mobile Phone: [phone] Relation: Son  Code Status: Full Code  Goals of care: Advanced Directive information Advanced Directives 06/15/2021  Does Patient Have a Medical Advance Directive? Yes  Type of Advance Directive Living will;Healthcare Power of Attorney  Does patient want to make changes to medical advance directive? No - Patient declined  Copy of [place] in Chart? No - copy requested  Would patient like information on creating a medical advance directive? No - Patient declined  Pre-existing out of facility DNR order (yellow form or pink MOST form) -     Chief Complaint  Patient presents with   Medical Management of Chronic Issues    Patient presents today for a 6 month follow-up.    HPI:  Pt is a 75 y.o. female seen today for 6 months follow up for medical management of chronic diseases.Has a medical history of hypertension,hyperlipidemia ,Osteopenia,cold sore on PRN valacyclovir,overweight among others.  She denies any acute issues this visit.states has been doing well.states previous sciatic nerve pain has resolved. Has been afraid to exercise to prevent recurrence.discussed walking exercise and cautioned to wear supportive shoes.Her weight has gone up from previous visit by 6 lbs.  Does not check her blood pressure at home but has a B/P cuff machine.readings are within normal range today.denies any headache,dizziness,vision changes,fatigue,chest tightness,palpitation,chest pain or shortness of breath.  Recent Bone density results done 06/20/2021 showed T-score -2.1 on right Femur Neck spine was excluded due to degenerative changes. Not currently on calcium or vitamin D supplement but  taking MVI daily.Denies any fall or fractures.   Recent mammogram showed no evidence of malignancy. Has had a annual dental cleaning.  Also follows up with Ophthalmologist.   Recent lab work also reviewed and discussed during visit.Unremarkable lab work except Total cholesterol was slightly higher 215 previous 210,LDL 111 previous 116 with Normal range HDL 80 and TRG 129 ( slightly higher than previous which was 117.   Immunization reviewed.States does not take any vaccine.    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 If you have any questions, ask your nurse or doctor.          CENTRUM ADULTS PO Take by mouth daily.   Flaxseed Oil 1000 MG Caps Take by mouth daily.   lisinopril 20 MG  tablet Commonly known as: ZESTRIL TAKE ONE TABLET BY MOUTH DAILY   Lysine HCl 1000 MG Tabs Take by mouth daily.   Magnesium 100 MG Caps Take 1 capsule by mouth daily.   naproxen sodium 220 MG tablet Commonly known as: ALEVE Take 220 mg by mouth as needed.   valACYclovir 1000 MG tablet Commonly known as: Valtrex Take 2 tablets (2,000 mg total) by mouth as needed (for cold sore).        Review of Systems  Constitutional:  Negative for appetite change,  chills, fatigue, fever and unexpected weight change.  HENT:  Negative for congestion, dental problem, ear discharge, ear pain, facial swelling, hearing loss, nosebleeds, postnasal drip, rhinorrhea, sinus pressure, sinus pain, sneezing, sore throat, tinnitus and trouble swallowing.   Eyes:  Positive for visual disturbance. Negative for pain, discharge, redness and itching.       Wears eye glasses  Respiratory:  Negative for cough, chest tightness, shortness of breath and wheezing.   Cardiovascular:  Negative for chest pain, palpitations and leg swelling.  Gastrointestinal:  Negative for abdominal distention, abdominal pain, blood in stool, constipation, diarrhea, nausea and vomiting.  Endocrine: Negative for cold intolerance, heat intolerance, polydipsia, polyphagia and polyuria.  Genitourinary:  Negative for difficulty urinating, dysuria, flank pain, frequency and urgency.  Musculoskeletal:  Negative for arthralgias, back pain, gait problem, joint swelling, myalgias, neck pain and neck stiffness.  Skin:  Negative for color change, pallor, rash and wound.  Neurological:  Negative for dizziness, syncope, speech difficulty, weakness, light-headedness, numbness and headaches.  Hematological:  Does not bruise/bleed easily.  Psychiatric/Behavioral:  Negative for agitation, behavioral problems, confusion, hallucinations, self-injury, sleep disturbance and suicidal ideas. The patient is not nervous/anxious.    Immunization History  Administered Date(s) Administered   Hepatitis A 04/26/2004, 12/14/2004   Hepatitis B, adult 10/07/2005   IPV 07/15/2008   Td 07/21/1976, 02/03/2004   Tdap 07/15/2008   Tetanus 07/22/2018   Yellow Fever 02/03/2004   Pertinent  Health Maintenance Due  Topic Date Due   COLONOSCOPY (Pts 45-50yr Insurance coverage will need to be confirmed)  03/11/2022   MAMMOGRAM  04/05/2023   DEXA SCAN  Completed   INFLUENZA VACCINE  Discontinued   Fall Risk  06/28/2021 06/15/2021  05/14/2021 12/26/2020 06/23/2020  Falls in the past year? 0 0 0 0 0  Number falls in past yr: 0 0 0 0 0  Injury with Fall? 0 0 0 0 0  Comment - - - - -  Risk for fall due to : History of fall(s) No Fall Risks No Fall Risks - -  Follow up Falls evaluation completed;Education provided;Falls prevention discussed Falls evaluation completed Falls evaluation completed - -   Functional Status Survey:    Vitals:   06/28/21 0818  BP: 128/88  Pulse: 70  Resp: 20  Temp: (!) 97.5 F (36.4 C)  TempSrc: Temporal  SpO2: 98%  Weight: 158 lb 9.6 oz (71.9 kg)  Height: '5\' 3"'  (1.6 m)   Body mass index is 28.09 kg/m. Physical Exam Vitals reviewed.  Constitutional:      General: She is not in acute distress.    Appearance: Normal appearance. She is overweight. She is not ill-appearing or diaphoretic.  HENT:     Head: Normocephalic.     Right Ear: Tympanic membrane, ear canal and external ear normal. There is no impacted cerumen.     Left Ear: Tympanic membrane, ear canal and external ear normal. There is no impacted cerumen.  Nose: Nose normal. No congestion or rhinorrhea.     Mouth/Throat:     Mouth: Mucous membranes are moist.     Pharynx: Oropharynx is clear. No oropharyngeal exudate or posterior oropharyngeal erythema.  Eyes:     General: No scleral icterus.       Right eye: No discharge.        Left eye: No discharge.     Extraocular Movements: Extraocular movements intact.     Conjunctiva/sclera: Conjunctivae normal.     Pupils: Pupils are equal, round, and reactive to light.     Comments: Corrective lens in place   Neck:     Vascular: No carotid bruit.  Cardiovascular:     Rate and Rhythm: Normal rate and regular rhythm.     Pulses: Normal pulses.     Heart sounds: Normal heart sounds. No murmur heard.   No friction rub. No gallop.  Pulmonary:     Effort: Pulmonary effort is normal. No respiratory distress.     Breath sounds: Normal breath sounds. No wheezing, rhonchi or rales.   Chest:     Chest wall: No tenderness.  Abdominal:     General: Bowel sounds are normal. There is no distension.     Palpations: Abdomen is soft. There is no mass.     Tenderness: There is no abdominal tenderness. There is no right CVA tenderness, left CVA tenderness, guarding or rebound.  Musculoskeletal:        General: No swelling or tenderness. Normal range of motion.     Cervical back: Normal range of motion. No rigidity or tenderness.     Right lower leg: No edema.     Left lower leg: No edema.  Lymphadenopathy:     Cervical: No cervical adenopathy.  Skin:    General: Skin is warm and dry.     Coloration: Skin is not pale.     Findings: No bruising, erythema, lesion or rash.  Neurological:     Mental Status: She is alert and oriented to person, place, and time.     Cranial Nerves: No cranial nerve deficit.     Sensory: No sensory deficit.     Motor: No weakness.     Coordination: Coordination normal.     Gait: Gait normal.  Psychiatric:        Mood and Affect: Mood normal.        Speech: Speech normal.        Behavior: Behavior normal.        Thought Content: Thought content normal.        Judgment: Judgment normal.    Labs reviewed: Recent Labs    12/21/20 0806 06/25/21 0823  NA 137 140  K 4.6 4.4  CL 102 103  CO2 27 30  GLUCOSE 96 94  BUN 21 17  CREATININE 0.79 0.69  CALCIUM 9.3 9.6   Recent Labs    12/21/20 0806 06/25/21 0823  AST 17 20  ALT 12 15  BILITOT 0.7 0.6  PROT 6.2 6.5   Recent Labs    12/21/20 0806 06/25/21 0823  WBC 4.3 4.6  NEUTROABS 2,098 2,088  HGB 13.7 14.1  HCT 40.8 42.2  MCV 97.1 98.1  PLT 204 175   Lab Results  Component Value Date   TSH 2.93 06/25/2021   Lab Results  Component Value Date   HGBA1C 5.0 06/19/2020   Lab Results  Component Value Date   CHOL 215 (H) 06/25/2021   HDL 80  06/25/2021   LDLCALC 111 (H) 06/25/2021   TRIG 129 06/25/2021   CHOLHDL 2.7 06/25/2021    Significant Diagnostic Results in  last 30 days:  DG BONE DENSITY (DXA)  Result Date: 06/20/2021 EXAM: DUAL X-RAY ABSORPTIOMETRY (DXA) FOR BONE MINERAL DENSITY IMPRESSION: Referring Physician:  Nelda Bucks [place] Your patient completed a bone mineral density test using GE Lunar iDXA system (analysis version: 16). Technologist: [place] PATIENT: Name: Adalai, Perl Patient ID: [id number] Birth Date: 25-Jun-1946 Height: 63.0 in. Sex: Female Measured: 06/20/2021 Weight: 156.6 lbs. Indications: Advanced Age, Caucasian, Estrogen Deficient, Postmenopausal Fractures: NONE Treatments: Multivitamin ASSESSMENT: The BMD measured at Femur Neck Right is 0.743 g/cm2 with a T-score of -2.1. This patient is considered osteopenic/low bone mass according to [facility]) criteria. The quality of the exam is good. The lumbar spine was excluded due to degenerative changes. Site Region Measured Date Measured Age YA BMD Significant CHANGE T-score DualFemur Neck Right 06/20/2021 74.7 -2.1 0.743 g/cm2 DualFemur Total Mean 06/20/2021 74.7 -1.8 0.782 g/cm2 Left Forearm Radius 33% 06/20/2021 74.7 -1.5 0.756 g/cm2 World Health Organization [facility]) criteria for post-menopausal, Caucasian Women: Normal       T-score at or above -1 SD Osteopenia   T-score between -1 and -2.5 SD Osteoporosis T-score at or below -2.5 SD RECOMMENDATION: 1. All patients should optimize calcium and vitamin D intake. 2. Consider FDA-approved medical therapies in postmenopausal women and men aged 69 years and older, based on the following: a. A hip or vertebral (clinical or morphometric) fracture. b. T-score = -2.5 at the femoral neck or spine after appropriate evaluation to exclude secondary causes. c. Low bone mass (T-score between -1.0 and -2.5 at the femoral neck or spine) and a 10-year probability of a hip fracture = 3% or a 10-year probability of a major osteoporosis-related fracture = 20% based on the [place]-adapted WHO algorithm. d. Clinician judgment and/or patient preferences may  indicate treatment for people with 10-year fracture probabilities above or below these levels. FOLLOW-UP: Patients with diagnosis of osteoporosis or at high risk for fracture should have regular bone mineral density tests.? Patients eligible for Medicare are allowed routine testing every 2 years.? The testing frequency can be increased to one year for patients who have rapidly progressing disease, are receiving or discontinuing medical therapy to restore bone mass, or have additional risk factors. I have reviewed this study and agree with the findings. [facility] FRAX* 10-year Probability of Fracture Based on femoral neck BMD: DualFemur (Right) Major Osteoporotic Fracture: 13.8% Hip Fracture:                3.7% Population:                  [place] (Caucasian) Risk Factors:                None *FRAX is a Materials engineer of the [employer] for Metabolic Bone Disease, a World Pharmacologist (WHO) [facility]. ASSESSMENT: The probability of a major osteoporotic fracture is 13.8% within the next ten years. The probability of a hip fracture is 3.7% within the next ten years. Electronically Signed   By: Rolm Baptise M.D.   On: 06/20/2021 08:53    Assessment/Plan  1. Essential hypertension B/p well controlled. - continue on lisinopril  - dietary modification and exercise discussed during visit.  - CBC with Differential/Platelet; Future - CMP with eGFR([facility]); Future - TSH; Future  2. Mixed hyperlipidemia LDL slightly high  Dietary modification  and exercise at least 3 times per week for 30 minutes.  - continue on Flaxseed - Lipid panel; Future  3. Osteopenia of right hip T-score -2.1 on right femur Neck no previous records to compared but had osteopenia in the past. Advised Increase foods high in calcium. Weight bearing exercises like walking and strengthening exercise by lifting small weight or using resistant bands. Start on calcium plus vit D  as below.  - Calcium Citrate-Vitamin D (CALCIUM CITRATE + D3) 200-250 MG-UNIT TABS; Take 1 tablet by mouth daily.  Dispense: 90 tablet; Refill: 1  4. Overweight (BMI 25.0-29.9) Has had 6 lbs weight gain over 6 months. Dietary modification and exercise as above   5. Body mass index 28.0-28.9, adult BMI 28.09  Dietary modification and exercise as above  Family/ staff Communication: Reviewed plan of care with patient verbalized understanding.   Labs/tests ordered:  - CBC with Differential/Platelet - CMP with eGFR([facility]) - TSH - Lipid panel  Next Appointment : 6 months for medical management of chronic issues.Fasting Labs prior to visit.    Sandrea Hughs, NP

## 2021-09-12 ENCOUNTER — Other Ambulatory Visit: Payer: Self-pay | Admitting: Family

## 2021-11-21 IMAGING — MG MM DIGITAL SCREENING BILAT W/ TOMO AND CAD
8 series · 8 of 24 positions shown · non-contrast
Comparison: Previous exam(s).

CLINICAL DATA: Screening.

EXAM:
DIGITAL SCREENING BILATERAL MAMMOGRAM WITH TOMOSYNTHESIS AND CAD
TECHNIQUE: Bilateral screening digital craniocaudal and mediolateral oblique
mammograms were obtained. Bilateral screening digital breast
tomosynthesis was performed. The images were evaluated with
computer-aided detection.

[R MLO synth-2D]
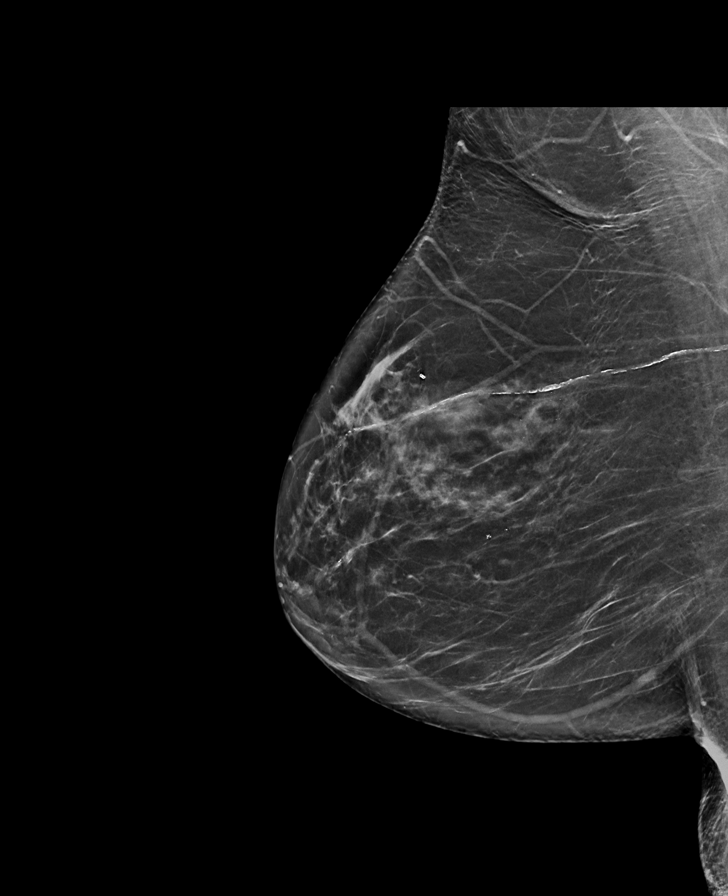

[L CC synth-2D]
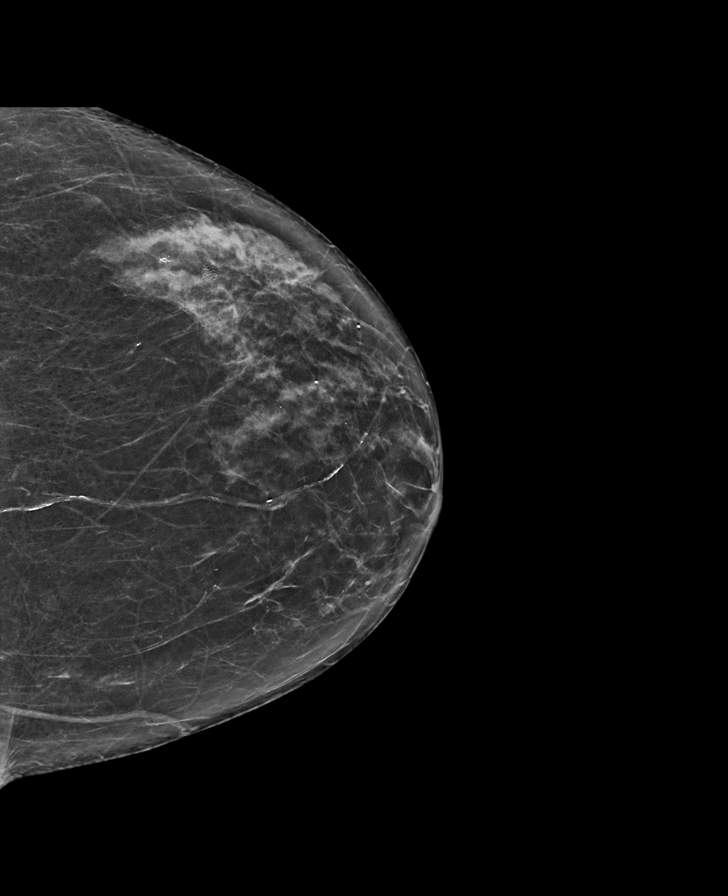

[L MLO synth-2D]
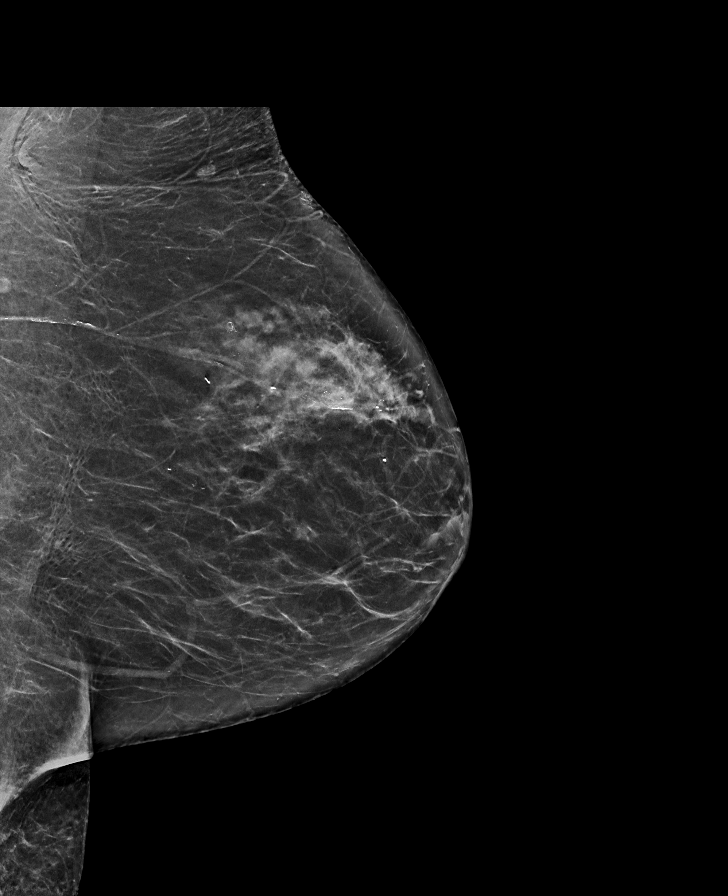

[R CC synth-2D]
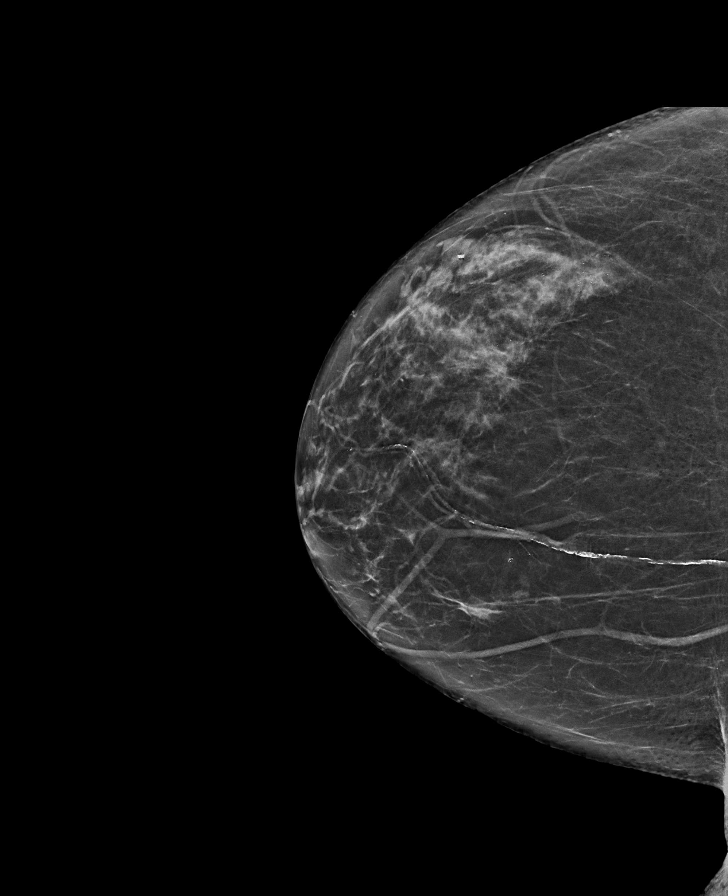

[L MLO tomo · tomo slice 37/74.0]
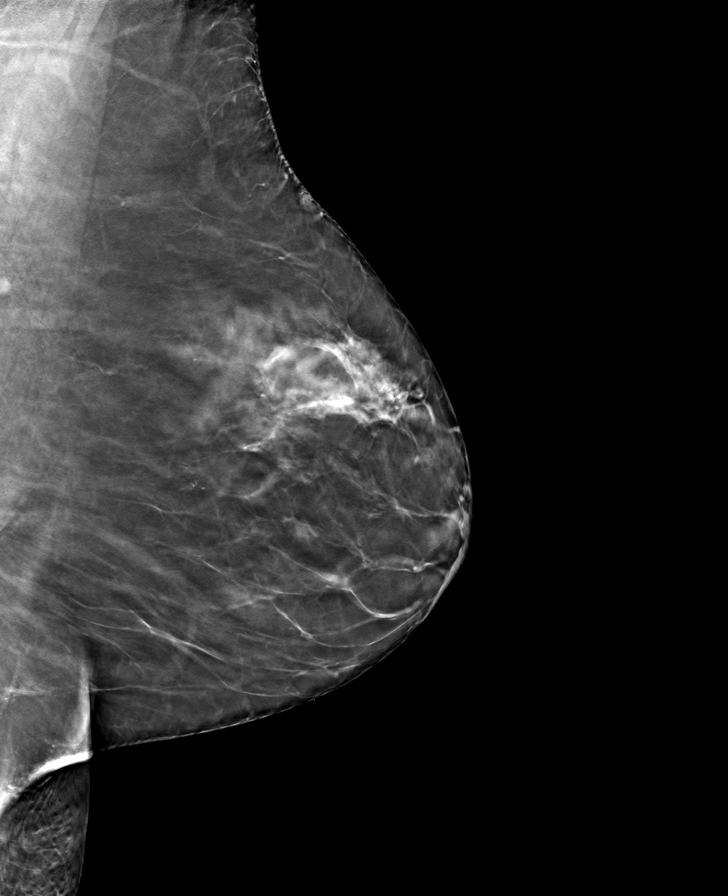

[R CC tomo · tomo slice 32/63.0]
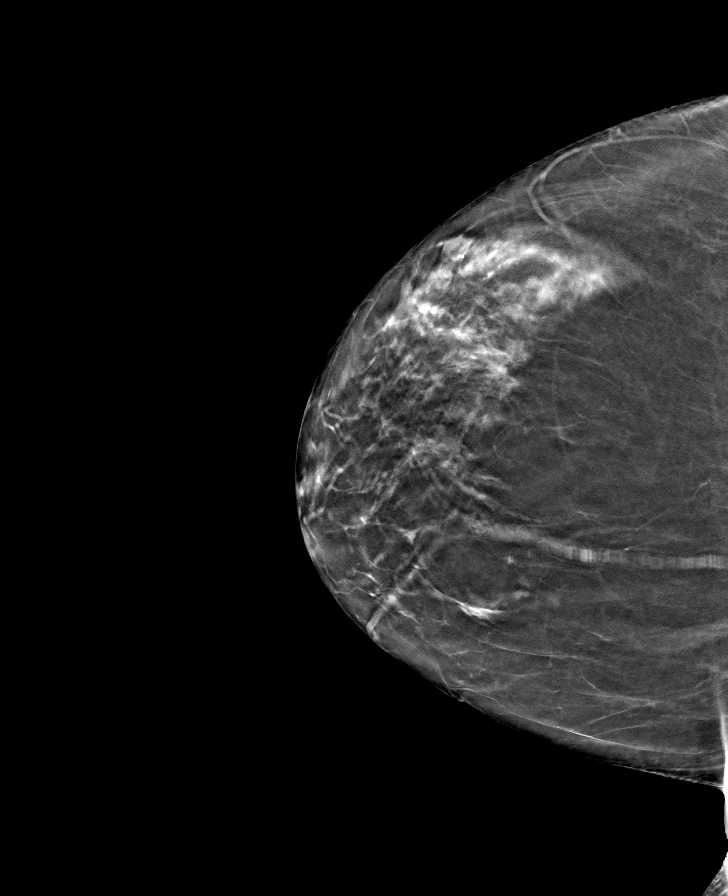

[L CC tomo · tomo slice 33/66.0]
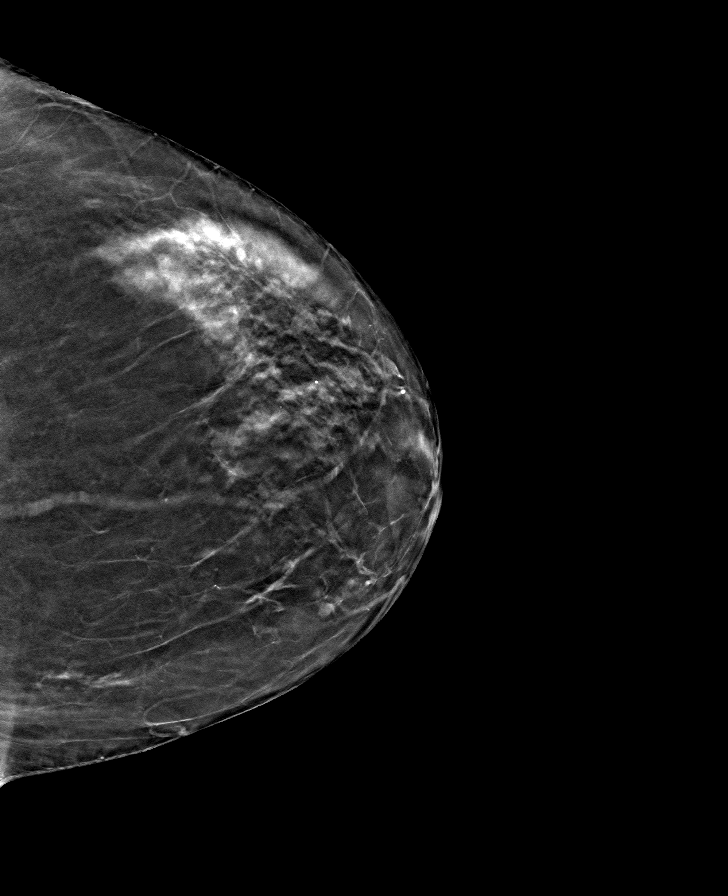

[R MLO tomo · tomo slice 37/74.0]
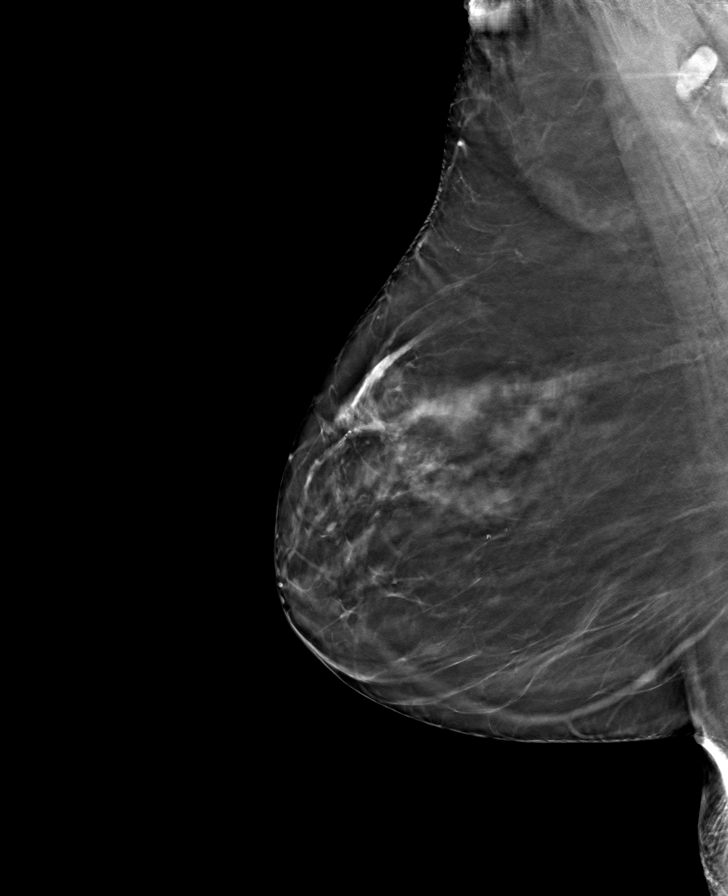

[8 of 24 positions shown; findings below may reference images not displayed]

ACR Breast Density Category b: There are scattered areas of
fibroglandular density.
FINDINGS: There are no findings suspicious for malignancy.
IMPRESSION: No mammographic evidence of malignancy. A result letter of this
screening mammogram will be mailed directly to the patient.

RECOMMENDATION:
Screening mammogram in one year. (Code:51-O-LD2)

BI-RADS CATEGORY  1: Negative.

## 2021-12-24 ENCOUNTER — Other Ambulatory Visit: Payer: Medicare Other

## 2021-12-24 ENCOUNTER — Other Ambulatory Visit: Payer: Self-pay

## 2021-12-24 DIAGNOSIS — E782 Mixed hyperlipidemia: Secondary | ICD-10-CM

## 2021-12-24 DIAGNOSIS — I10 Essential (primary) hypertension: Secondary | ICD-10-CM | POA: Diagnosis not present

## 2021-12-24 DIAGNOSIS — R7309 Other abnormal glucose: Secondary | ICD-10-CM | POA: Diagnosis not present

## 2021-12-27 ENCOUNTER — Ambulatory Visit: Payer: Medicare Other | Admitting: Family

## 2021-12-27 LAB — CBC WITH DIFFERENTIAL/PLATELET
Absolute Monocytes: 710 cells/uL (ref 200–950)
Basophils Absolute: 27 cells/uL (ref 0–200)
Basophils Relative: 0.4 %
Eosinophils Absolute: 141 cells/uL (ref 15–500)
Eosinophils Relative: 2.1 %
HCT: 41.1 % (ref 35.0–45.0)
Hemoglobin: 13.9 g/dL (ref 11.7–15.5)
Lymphs Abs: 1628 cells/uL (ref 850–3900)
MCH: 33.3 pg — ABNORMAL HIGH (ref 27.0–33.0)
MCHC: 33.8 g/dL (ref 32.0–36.0)
MCV: 98.6 fL (ref 80.0–100.0)
MPV: 10.5 fL (ref 7.5–12.5)
Monocytes Relative: 10.6 %
Neutro Abs: 4194 cells/uL (ref 1500–7800)
Neutrophils Relative %: 62.6 %
Platelets: 237 10*3/uL (ref 140–400)
RBC: 4.17 10*6/uL (ref 3.80–5.10)
RDW: 12 % (ref 11.0–15.0)
Total Lymphocyte: 24.3 %
WBC: 6.7 10*3/uL (ref 3.8–10.8)

## 2021-12-27 LAB — LIPID PANEL
Cholesterol: 205 mg/dL — ABNORMAL HIGH (ref <200)
HDL: 67 mg/dL (ref >=50)
LDL Cholesterol (Calc): 117 mg/dL (calc) — ABNORMAL HIGH
Non-HDL Cholesterol (Calc): 138 mg/dL (calc) — ABNORMAL HIGH (ref <130)
Total CHOL/HDL Ratio: 3.1 (calc) (ref <5.0)
Triglycerides: 106 mg/dL (ref <150)

## 2021-12-27 LAB — COMPLETE METABOLIC PANEL WITH GFR
AG Ratio: 1.6 (calc) (ref 1.0–2.5)
ALT: 11 U/L (ref 6–29)
AST: 14 U/L (ref 10–35)
Albumin: 4.2 g/dL (ref 3.6–5.1)
Alkaline phosphatase (APISO): 71 U/L (ref 37–153)
BUN: 20 mg/dL (ref 7–25)
CO2: 31 mmol/L (ref 20–32)
Calcium: 10.2 mg/dL (ref 8.6–10.4)
Chloride: 98 mmol/L (ref 98–110)
Creat: 0.88 mg/dL (ref 0.60–1.00)
Globulin: 2.6 g/dL (calc) (ref 1.9–3.7)
Glucose, Bld: 103 mg/dL — ABNORMAL HIGH (ref 65–99)
Potassium: 4.7 mmol/L (ref 3.5–5.3)
Sodium: 138 mmol/L (ref 135–146)
Total Bilirubin: 0.6 mg/dL (ref 0.2–1.2)
Total Protein: 6.8 g/dL (ref 6.1–8.1)
eGFR: 68 mL/min/{1.73_m2} (ref >=60)

## 2021-12-27 LAB — TSH: TSH: 3.76 mIU/L (ref 0.40–4.50)

## 2021-12-27 LAB — HEMOGLOBIN A1C
Hgb A1c MFr Bld: 5.3 % of total Hgb (ref <5.7)
Mean Plasma Glucose: 105 mg/dL
eAG (mmol/L): 5.8 mmol/L

## 2021-12-27 LAB — TEST AUTHORIZATION

## 2021-12-28 ENCOUNTER — Encounter: Payer: Self-pay | Admitting: Family

## 2021-12-31 ENCOUNTER — Encounter: Payer: Self-pay | Admitting: Family

## 2021-12-31 ENCOUNTER — Ambulatory Visit (INDEPENDENT_AMBULATORY_CARE_PROVIDER_SITE_OTHER): Payer: Medicare Other | Admitting: Family

## 2021-12-31 ENCOUNTER — Other Ambulatory Visit: Payer: Self-pay

## 2021-12-31 VITALS — BP 120/88 | HR 59 | Temp 96.9°F | Resp 16 | Ht 63.0 in | Wt 157.4 lb

## 2021-12-31 DIAGNOSIS — E663 Overweight: Secondary | ICD-10-CM

## 2021-12-31 DIAGNOSIS — I10 Essential (primary) hypertension: Secondary | ICD-10-CM

## 2021-12-31 DIAGNOSIS — Z8619 Personal history of other infectious and parasitic diseases: Secondary | ICD-10-CM | POA: Diagnosis not present

## 2021-12-31 DIAGNOSIS — M85851 Other specified disorders of bone density and structure, right thigh: Secondary | ICD-10-CM | POA: Diagnosis not present

## 2021-12-31 DIAGNOSIS — E782 Mixed hyperlipidemia: Secondary | ICD-10-CM | POA: Diagnosis not present

## 2021-12-31 MED ORDER — VALACYCLOVIR HCL 1 G PO TABS: 2000.0000 mg | ORAL_TABLET | ORAL | 2 refills | Status: DC | PRN

## 2021-12-31 MED ORDER — LISINOPRIL 20 MG PO TABS: 20.0000 mg | ORAL_TABLET | Freq: Every day | ORAL | 1 refills | Status: DC

## 2021-12-31 NOTE — Progress Notes (Signed)
? ?Provider: [name] FNP-C  ? ?Yuvia Plant, Nelda Bucks, NP ? ?Patient Care Team: ?Kaidan Spengler, Nelda Bucks, NP as PCP - General (Family Medicine) ?Alda Berthold, DO as Consulting Physician (Neurology) ? ?Extended Emergency Contact Information ?Primary Emergency Contact: Ritchie,Rob ?Mobile Phone: [phone] ?Relation: Son ? ?Code Status:  Full Code  ?Goals of care: Advanced Directive information ? ?  12/28/2021  ?  4:51 PM  ?Advanced Directives  ?Does Patient Have a Medical Advance Directive? Yes  ?Type of Paramedic of [place];Living will;Out of facility DNR (pink MOST or yellow form)  ?Does patient want to make changes to medical advance directive? No - Patient declined  ?Copy of [place] in Chart? No - copy requested  ? ? ? ?Chief Complaint  ?Patient presents with  ? Medical Management of Chronic Issues  ?  6 month follow up/ Discuss recent lab work.  ? ? ?HPI:  ?Pt is a 76 y.o. female seen today for 6 months for follow-up medical management of chronic diseases.  Has a medical history of essential hypertension, hyperlipidemia osteopenia, overweight among other conditions. ?Recent lab work reviewed and discussed.  All labs were unremarkable except the following: ? ?Hyperlipidemia -total cholesterol 205, triglycerides 106 and LDL 117 that is improved compared to previous.  States has changed her diet.  Tries to exercise by walking but she is limited due to intermittent sciatica mouth. ? ?Impaired glucose level -glucose was 103, hemoglobin A1c 5.3 ?Has seen ophthalmology Dr.Shapiro has new eye glasses. ? ?Hypertension -no home blood pressure readings for review.  Takes lisinopril 20 mg daily.denies any headache,dizziness,vision changes,fatigue,chest tightness,palpitation,chest pain or shortness of breath.    ? ? ?Follows up with dental every 6 months.  ? 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  Tends to occur more often with stress level or when out in the  sunshine.  She applies sunscreen and wears a hat when going outside.  Takes valacyclovir on onset of symptoms which has been effective.  Requests refills for valacyclovir today. ? ?Stated continued home Cologuard test and results were negative. ? ?Due for pneumococcal and influenza vaccine but has declined all vaccines. ? ? ?Past Medical History:  ?Diagnosis Date  ? High blood pressure   ? Hyperlipidemia   ? Per previous Cardiology Record   ? Impaired fasting glucose   ? Per previous Cardiology records   ? Macrocytosis 04/02/2016  ? Per previous Cardiology records   ? Osteopenia 04/02/2016  ? Per previous Cardiology records   ? ?Past Surgical History:  ?Procedure Laterality Date  ? APPENDECTOMY    ? APPENDECTOMY  1977  ? BREAST BIOPSY  03/07/2014  ? Benign  ? BUNIONECTOMY  2000  ? COLONOSCOPY  10/07/2012  ? Per previous Cardiology records   ? TONSILLECTOMY AND ADENOIDECTOMY  10/08/1955  ? Per previous Cardiology records   ? ? ?Allergies  ?Allergen Reactions  ? Latex   ? ? ?Allergies as of 12/31/2021   ? ?   Reactions  ? Latex   ? ?  ? ?  ?Medication List  ?  ? ?  ? Accurate as of December 31, 2021  9:57 AM. If you have any questions, ask your nurse or doctor.  ?  ?  ? ?  ? ?Calcium Citrate + D3 200-6.25 MG-MCG Tabs ?Generic drug: Calcium Citrate-Vitamin D ?Take 1 tablet by mouth daily. ?  ?CENTRUM ADULTS PO ?Take by mouth daily. ?  ?Flaxseed Oil 1000 MG Caps ?  Take by mouth daily. ?  ?lisinopril 20 MG tablet ?Commonly known as: ZESTRIL ?Take 1 tablet (20 mg total) by mouth daily. ?  ?Lysine HCl 1000 MG Tabs ?Take by mouth daily. ?  ?Magnesium 100 MG Caps ?Take 1 capsule by mouth daily. ?  ?naproxen sodium 220 MG tablet ?Commonly known as: ALEVE ?Take 220 mg by mouth as needed. ?  ?valACYclovir 1000 MG tablet ?Commonly known as: Valtrex ?Take 2 tablets (2,000 mg total) by mouth as needed (for cold sore). ?  ? ?  ? ? ?Review of Systems  ?Constitutional:  Negative for appetite change, chills, fatigue, fever and unexpected  weight change.  ?HENT:  Negative for congestion, dental problem, ear discharge, ear pain, facial swelling, hearing loss, nosebleeds, postnasal drip, rhinorrhea, sinus pressure, sinus pain, sneezing, sore throat, tinnitus and trouble swallowing.   ?Eyes:  Positive for visual disturbance. Negative for pain, discharge, redness and itching.  ?     Wears eyeglasses  ?Respiratory:  Negative for cough, chest tightness, shortness of breath and wheezing.   ?Cardiovascular:  Negative for chest pain, palpitations and leg swelling.  ?Gastrointestinal:  Negative for abdominal distention, abdominal pain, blood in stool, constipation, diarrhea, nausea and vomiting.  ?Endocrine: Negative for cold intolerance, heat intolerance, polydipsia, polyphagia and polyuria.  ?Genitourinary:  Negative for difficulty urinating, dysuria, flank pain, frequency and urgency.  ?Musculoskeletal:  Negative for arthralgias, back pain, gait problem, joint swelling, myalgias, neck pain and neck stiffness.  ?Skin:  Negative for color change, pallor, rash and wound.  ?Neurological:  Negative for dizziness, syncope, speech difficulty, weakness, light-headedness, numbness and headaches.  ?Hematological:  Does not bruise/bleed easily.  ?Psychiatric/Behavioral:  Negative for agitation, behavioral problems, confusion, hallucinations, self-injury, sleep disturbance and suicidal ideas. The patient is not nervous/anxious.   ? ?Immunization History  ?Administered Date(s) Administered  ? Hepatitis A 04/26/2004, 12/14/2004  ? Hepatitis B, adult 10/07/2005  ? IPV 07/15/2008  ? Td 07/21/1976, 02/03/2004  ? Tdap 07/15/2008  ? Tetanus 07/22/2018  ? Yellow Fever 02/03/2004  ? ?Pertinent  Health Maintenance Due  ?Topic Date Due  ? COLONOSCOPY (Pts 45-65yr Insurance coverage will need to be confirmed)  03/11/2022  ? DEXA SCAN  Completed  ? INFLUENZA VACCINE  Discontinued  ? ? ?  12/26/2020  ? 12:50 PM 05/14/2021  ?  1:22 PM 06/15/2021  ? 11:16 AM 06/28/2021  ?  8:18 AM 12/28/2021   ?  4:51 PM  ?Fall Risk  ?Falls in the past year? 0 0 0 0 0  ?Was there an injury with Fall? 0 0 0 0 0  ?Fall Risk Category Calculator 0 0 0 0 0  ?Fall Risk Category Low Low Low Low Low  ?Patient Fall Risk Level Low fall risk Low fall risk Low fall risk Low fall risk Low fall risk  ?Patient at Risk for Falls Due to  No Fall Risks No Fall Risks History of fall(s) No Fall Risks  ?Fall risk Follow up  Falls evaluation completed Falls evaluation completed Falls evaluation completed;Education provided;Falls prevention discussed Falls evaluation completed  ? ?Functional Status Survey: ?  ? ?Vitals:  ? 12/31/21 0858  ?BP: 120/88  ?Pulse: (!) 59  ?Resp: 16  ?Temp: (!) 96.9 ?F (36.1 ?C)  ?SpO2: 98%  ?Weight: 157 lb 6.4 oz (71.4 kg)  ?Height: '5\' 3"'$  (1.6 m)  ? ?Body mass index is 27.88 kg/m?.Marland Kitchen?Physical Exam ?Vitals reviewed.  ?Constitutional:   ?   General: She is not in acute distress. ?  Appearance: Normal appearance. She is normal weight. She is not ill-appearing or diaphoretic.  ?HENT:  ?   Head: Normocephalic.  ?   Right Ear: Tympanic membrane, ear canal and external ear normal. There is no impacted cerumen.  ?   Left Ear: Tympanic membrane, ear canal and external ear normal. There is no impacted cerumen.  ?   Nose: Nose normal. No congestion or rhinorrhea.  ?   Mouth/Throat:  ?   Mouth: Mucous membranes are moist.  ?   Pharynx: Oropharynx is clear. No oropharyngeal exudate or posterior oropharyngeal erythema.  ?Eyes:  ?   General: No scleral icterus.    ?   Right eye: No discharge.     ?   Left eye: No discharge.  ?   Extraocular Movements: Extraocular movements intact.  ?   Conjunctiva/sclera: Conjunctivae normal.  ?   Pupils: Pupils are equal, round, and reactive to light.  ?   Comments: Corrective lens in place  ?Neck:  ?   Vascular: No carotid bruit.  ?Cardiovascular:  ?   Rate and Rhythm: Normal rate and regular rhythm.  ?   Pulses: Normal pulses.  ?   Heart sounds: Normal heart sounds. No murmur heard. ?  No  friction rub. No gallop.  ?Pulmonary:  ?   Effort: Pulmonary effort is normal. No respiratory distress.  ?   Breath sounds: Normal breath sounds. No wheezing, rhonchi or rales.  ?Chest:  ?   Chest wall: No

## 2022-03-18 ENCOUNTER — Other Ambulatory Visit: Payer: Self-pay | Admitting: Family

## 2022-03-18 DIAGNOSIS — Z1231 Encounter for screening mammogram for malignant neoplasm of breast: Secondary | ICD-10-CM

## 2022-03-19 ENCOUNTER — Encounter: Payer: Self-pay | Admitting: Family

## 2022-03-19 ENCOUNTER — Ambulatory Visit (INDEPENDENT_AMBULATORY_CARE_PROVIDER_SITE_OTHER): Payer: Medicare Other | Admitting: Family

## 2022-03-19 VITALS — BP 104/70 | HR 82 | Temp 97.2°F | Resp 16 | Ht 63.0 in | Wt 154.0 lb

## 2022-03-19 DIAGNOSIS — K219 Gastro-esophageal reflux disease without esophagitis: Secondary | ICD-10-CM

## 2022-03-19 DIAGNOSIS — R0789 Other chest pain: Secondary | ICD-10-CM

## 2022-03-19 MED ORDER — OMEPRAZOLE 20 MG PO CPDR: 20.0000 mg | DELAYED_RELEASE_CAPSULE | Freq: Every day | ORAL | 3 refills | Status: DC

## 2022-03-19 NOTE — Patient Instructions (Signed)
Gastroesophageal Reflux Disease, Adult Gastroesophageal reflux (GER) happens when acid from the stomach flows up into the tube that connects the mouth and the stomach (esophagus). Normally, food travels down the esophagus and stays in the stomach to be digested. However, when a person has GER, food and stomach acid sometimes move back up into the esophagus. If this becomes a more serious problem, the person may be diagnosed with a disease called gastroesophageal reflux disease (GERD). GERD occurs when the reflux: Happens often. Causes frequent or severe symptoms. Causes problems such as damage to the esophagus. When stomach acid comes in contact with the esophagus, the acid may cause inflammation in the esophagus. Over time, GERD may create small holes (ulcers) in the lining of the esophagus. What are the causes? This condition is caused by a problem with the muscle between the esophagus and the stomach (lower esophageal sphincter, or LES). Normally, the LES muscle closes after food passes through the esophagus to the stomach. When the LES is weakened or abnormal, it does not close properly, and that allows food and stomach acid to go back up into the esophagus. The LES can be weakened by certain dietary substances, medicines, and medical conditions, including: Tobacco use. Pregnancy. Having a hiatal hernia. Alcohol use. Certain foods and beverages, such as coffee, chocolate, onions, and peppermint. What increases the risk? You are more likely to develop this condition if you: Have an increased body weight. Have a connective tissue disorder. Take NSAIDs, such as ibuprofen. What are the signs or symptoms? Symptoms of this condition include: Heartburn. Difficult or painful swallowing and the feeling of having a lump in the throat. A bitter taste in the mouth. Bad breath and having a large amount of saliva. Having an upset or bloated stomach and belching. Chest pain. Different conditions can  cause chest pain. Make sure you see your health care provider if you experience chest pain. Shortness of breath or wheezing. Ongoing (chronic) cough or a nighttime cough. Wearing away of tooth enamel. Weight loss. How is this diagnosed? This condition may be diagnosed based on a medical history and a physical exam. To determine if you have mild or severe GERD, your health care provider may also monitor how you respond to treatment. You may also have tests, including: A test to examine your stomach and esophagus with a small camera (endoscopy). A test that measures the acidity level in your esophagus. A test that measures how much pressure is on your esophagus. A barium swallow or modified barium swallow test to show the shape, size, and functioning of your esophagus. How is this treated? Treatment for this condition may vary depending on how severe your symptoms are. Your health care provider may recommend: Changes to your diet. Medicine. Surgery. The goal of treatment is to help relieve your symptoms and to prevent complications. Follow these instructions at home: Eating and drinking  Follow a diet as recommended by your health care provider. This may involve avoiding foods and drinks such as: Coffee and tea, with or without caffeine. Drinks that contain alcohol. Energy drinks and sports drinks. Carbonated drinks or sodas. Chocolate and cocoa. Peppermint and mint flavorings. Garlic and onions. Horseradish. Spicy and acidic foods, including peppers, chili powder, curry powder, vinegar, hot sauces, and barbecue sauce. Citrus fruit juices and citrus fruits, such as oranges, lemons, and limes. Tomato-based foods, such as red sauce, chili, salsa, and pizza with red sauce. Fried and fatty foods, such as donuts, french fries, potato chips, and high-fat dressings.   High-fat meats, such as hot dogs and fatty cuts of red and white meats, such as rib eye steak, sausage, ham, and  bacon. High-fat dairy items, such as whole milk, butter, and cream cheese. Eat small, frequent meals instead of large meals. Avoid drinking large amounts of liquid with your meals. Avoid eating meals during the 2-3 hours before bedtime. Avoid lying down right after you eat. Do not exercise right after you eat. Lifestyle  Do not use any products that contain nicotine or tobacco. These products include cigarettes, chewing tobacco, and vaping devices, such as e-cigarettes. If you need help quitting, ask your health care provider. Try to reduce your stress by using methods such as yoga or meditation. If you need help reducing stress, ask your health care provider. If you are overweight, reduce your weight to an amount that is healthy for you. Ask your health care provider for guidance about a safe weight loss goal. General instructions Pay attention to any changes in your symptoms. Take over-the-counter and prescription medicines only as told by your health care provider. Do not take aspirin, ibuprofen, or other NSAIDs unless your health care provider told you to take these medicines. Wear loose-fitting clothing. Do not wear anything tight around your waist that causes pressure on your abdomen. Raise (elevate) the head of your bed about 6 inches (15 cm). You can use a wedge to do this. Avoid bending over if this makes your symptoms worse. Keep all follow-up visits. This is important. Contact a health care provider if: You have: New symptoms. Unexplained weight loss. Difficulty swallowing or it hurts to swallow. Wheezing or a persistent cough. A hoarse voice. Your symptoms do not improve with treatment. Get help right away if: You have sudden pain in your arms, neck, jaw, teeth, or back. You suddenly feel sweaty, dizzy, or light-headed. You have chest pain or shortness of breath. You vomit and the vomit is green, yellow, or black, or it looks like blood or coffee grounds. You faint. You  have stool that is red, bloody, or black. You cannot swallow, drink, or eat. These symptoms may represent a serious problem that is an emergency. Do not wait to see if the symptoms will go away. Get medical help right away. Call your local emergency services (911 in the [place] Do not drive yourself to the hospital. Summary Gastroesophageal reflux happens when acid from the stomach flows up into the esophagus. GERD is a disease in which the reflux happens often, causes frequent or severe symptoms, or causes problems such as damage to the esophagus. Treatment for this condition may vary depending on how severe your symptoms are. Your health care provider may recommend diet and lifestyle changes, medicine, or surgery. Contact a health care provider if you have new or worsening symptoms. Take over-the-counter and prescription medicines only as told by your health care provider. Do not take aspirin, ibuprofen, or other NSAIDs unless your health care provider told you to do so. Keep all follow-up visits as told by your health care provider. This is important. This information is not intended to replace advice given to you by your health care provider. Make sure you discuss any questions you have with your health care provider. Document Revised: 04/03/2020 Document Reviewed: 04/03/2020 [employer] Patient Education  2023 [employer]  

## 2022-03-19 NOTE — Progress Notes (Signed)
Provider: [name] FNP-C  Aaryn Parrilla, Nelda Bucks, NP  Patient Care Team: Malik Ruffino, Nelda Bucks, NP as PCP - General (Family Medicine) Alda Berthold, DO as Consulting Physician (Neurology)  Extended Emergency Contact Information Primary Emergency Contact: Ritchie,Rob Mobile Phone: [phone] Relation: Son  Code Status:  Full Code  Goals of care: Advanced Directive information    03/19/2022    9:44 AM  Advanced Directives  Does Patient Have a Medical Advance Directive? Yes  Type of Paramedic of [place];Living will;Out of facility DNR (pink MOST or yellow form)  Does patient want to make changes to medical advance directive? No - Patient declined  Copy of [place] in Chart? No - copy requested     Chief Complaint  Patient presents with   Acute Visit    Patient complains of sternum discomfort for about 2 1/2 weeks. Mainly after she eats and when she walks she feels pressure.     HPI:  Pt is a 76 y.o. female seen today for an acute visit for evaluation of mid chest pressure one week and a half.Pressure is described as intermittent without any radiation to arm or jaw.  Denies any nausea or vomiting. Has been blenching a lot.Worst at night when trying to lie down.Has been eating more salad.has had to take tums. She denies any headache,dizziness,vision changes,fatigue,chest tightness,palpitation,chest pain or shortness of breath.     Past Medical History:  Diagnosis Date   High blood pressure    Hyperlipidemia    Per previous Cardiology Record    Impaired fasting glucose    Per previous Cardiology records    Macrocytosis 04/02/2016   Per previous Cardiology records    Osteopenia 04/02/2016   Per previous Cardiology records    Past Surgical History:  Procedure Laterality Date   APPENDECTOMY     APPENDECTOMY  1977   BREAST BIOPSY  03/07/2014   Benign   BUNIONECTOMY  2000   COLONOSCOPY  10/07/2012   Per previous Cardiology  records    TONSILLECTOMY AND ADENOIDECTOMY  10/08/1955   Per previous Cardiology records     Allergies  Allergen Reactions   Latex     Outpatient Encounter Medications as of 03/19/2022  Medication Sig   Calcium Citrate-Vitamin D (CALCIUM CITRATE + D3) 200-250 MG-UNIT TABS Take 1 tablet by mouth daily.   Flaxseed, Linseed, (FLAXSEED OIL) 1000 MG CAPS Take by mouth daily.   lisinopril (ZESTRIL) 20 MG tablet Take 1 tablet (20 mg total) by mouth daily.   Lysine HCl 1000 MG TABS Take by mouth daily.   Magnesium 100 MG CAPS Take 1 capsule by mouth once a week.   Multiple Vitamins-Minerals (CENTRUM ADULTS PO) Take by mouth daily.   naproxen sodium (ALEVE) 220 MG tablet Take 220 mg by mouth as needed.   valACYclovir (VALTREX) 1000 MG tablet Take 2 tablets (2,000 mg total) by mouth as needed (for cold sore).   No facility-administered encounter medications on file as of 03/19/2022.    Review of Systems  Constitutional:  Negative for appetite change, chills, fatigue, fever and unexpected weight change.  HENT:  Negative for congestion, dental problem, ear discharge, ear pain, facial swelling, hearing loss, nosebleeds, postnasal drip, rhinorrhea, sinus pressure, sinus pain, sneezing, sore throat, tinnitus and trouble swallowing.   Eyes:  Negative for pain, discharge, redness, itching and visual disturbance.  Respiratory:  Negative for cough, chest tightness, shortness of breath and wheezing.   Cardiovascular:  Negative for chest pain,  palpitations and leg swelling.       Chest pressure   Gastrointestinal:  Negative for abdominal distention, abdominal pain, blood in stool, constipation, diarrhea, nausea and vomiting.       Blenching   Endocrine: Negative for cold intolerance, heat intolerance, polydipsia, polyphagia and polyuria.  Genitourinary:  Negative for difficulty urinating, dysuria, flank pain, frequency and urgency.  Musculoskeletal:  Negative for arthralgias, back pain, gait problem,  joint swelling, myalgias, neck pain and neck stiffness.  Skin:  Negative for color change, pallor, rash and wound.  Neurological:  Negative for dizziness, syncope, speech difficulty, weakness, light-headedness, numbness and headaches.  Hematological:  Does not bruise/bleed easily.  Psychiatric/Behavioral:  Negative for agitation, behavioral problems, confusion, hallucinations, self-injury, sleep disturbance and suicidal ideas. The patient is not nervous/anxious.     Immunization History  Administered Date(s) Administered   Hepatitis A 04/26/2004, 12/14/2004   Hepatitis B, adult 10/07/2005   IPV 07/15/2008   Td 07/21/1976, 02/03/2004   Tdap 07/15/2008   Tetanus 07/22/2018   Yellow Fever 02/03/2004   Pertinent  Health Maintenance Due  Topic Date Due   COLONOSCOPY (Pts 45-102yr Insurance coverage will need to be confirmed)  03/11/2022   DEXA SCAN  Completed   INFLUENZA VACCINE  Discontinued      05/14/2021    1:22 PM 06/15/2021   11:16 AM 06/28/2021    8:18 AM 12/28/2021    4:51 PM 03/19/2022    9:44 AM  Fall Risk  Falls in the past year? 0 0 0 0 0  Was there an injury with Fall? 0 0 0 0 0  Fall Risk Category Calculator 0 0 0 0 0  Fall Risk Category Low Low Low Low Low  Patient Fall Risk Level Low fall risk Low fall risk Low fall risk Low fall risk Low fall risk  Patient at Risk for Falls Due to No Fall Risks No Fall Risks History of fall(s) No Fall Risks No Fall Risks  Fall risk Follow up Falls evaluation completed Falls evaluation completed Falls evaluation completed;Education provided;Falls prevention discussed Falls evaluation completed Falls evaluation completed   Functional Status Survey:    Vitals:   03/19/22 0938  BP: 104/70  Pulse: 82  Resp: 16  Temp: (!) 97.2 F (36.2 C)  SpO2: 97%  Weight: 154 lb (69.9 kg)  Height: '5\' 3"'$  (1.6 m)   Body mass index is 27.28 kg/m. Physical Exam Vitals reviewed.  Constitutional:      General: She is not in acute distress.     Appearance: Normal appearance. She is normal weight. She is not ill-appearing or diaphoretic.  HENT:     Head: Normocephalic.     Mouth/Throat:     Mouth: Mucous membranes are moist.     Pharynx: Oropharynx is clear. No oropharyngeal exudate or posterior oropharyngeal erythema.  Eyes:     General: No scleral icterus.       Right eye: No discharge.        Left eye: No discharge.     Extraocular Movements: Extraocular movements intact.     Conjunctiva/sclera: Conjunctivae normal.     Pupils: Pupils are equal, round, and reactive to light.  Neck:     Vascular: No carotid bruit.  Cardiovascular:     Rate and Rhythm: Normal rate and regular rhythm.     Pulses: Normal pulses.     Heart sounds: Normal heart sounds. No murmur heard.    No friction rub. No gallop.  Pulmonary:  Effort: Pulmonary effort is normal. No respiratory distress.     Breath sounds: Normal breath sounds. No wheezing, rhonchi or rales.  Chest:     Chest wall: No tenderness.  Abdominal:     General: Bowel sounds are normal. There is no distension.     Palpations: Abdomen is soft. There is no mass.     Tenderness: There is no abdominal tenderness. There is no right CVA tenderness, left CVA tenderness, guarding or rebound.  Musculoskeletal:        General: No swelling or tenderness. Normal range of motion.     Cervical back: Normal range of motion. No rigidity or tenderness.     Right lower leg: No edema.     Left lower leg: No edema.  Lymphadenopathy:     Cervical: No cervical adenopathy.  Skin:    General: Skin is warm and dry.     Coloration: Skin is not pale.     Findings: No bruising, erythema, lesion or rash.  Neurological:     Mental Status: She is alert and oriented to person, place, and time.     Cranial Nerves: No cranial nerve deficit.     Sensory: No sensory deficit.     Motor: No weakness.     Coordination: Coordination normal.     Gait: Gait normal.  Psychiatric:        Mood and Affect: Mood  normal.        Speech: Speech normal.        Behavior: Behavior normal.        Thought Content: Thought content normal.        Judgment: Judgment normal.     Labs reviewed: Recent Labs    06/25/21 0823 12/24/21 0813  NA 140 138  K 4.4 4.7  CL 103 98  CO2 30 31  GLUCOSE 94 103*  BUN 17 20  CREATININE 0.69 0.88  CALCIUM 9.6 10.2   Recent Labs    06/25/21 0823 12/24/21 0813  AST 20 14  ALT 15 11  BILITOT 0.6 0.6  PROT 6.5 6.8   Recent Labs    06/25/21 0823 12/24/21 0813  WBC 4.6 6.7  NEUTROABS 2,088 4,194  HGB 14.1 13.9  HCT 42.2 41.1  MCV 98.1 98.6  PLT 175 237   Lab Results  Component Value Date   TSH 3.76 12/24/2021   Lab Results  Component Value Date   HGBA1C 5.3 12/24/2021   Lab Results  Component Value Date   CHOL 205 (H) 12/24/2021   HDL 67 12/24/2021   LDLCALC 117 (H) 12/24/2021   TRIG 106 12/24/2021   CHOLHDL 3.1 12/24/2021    Significant Diagnostic Results in last 30 days:  No results found.  Assessment/Plan  1. Chest pressure Intermittent mid chest pressure worse with food especially when trying to lie down at night. Suspect possible GERD related versus cardiac.Has taken Tums with relief will rule out cardiac issues with EKG - EKG 12-Lead indicates normal sinus rhythm with heart rate 68 bpm no other previous EKG for comparison.  2. Gastroesophageal reflux disease without esophagitis Has taken Tums with relief Recommend starting on omeprazole daily as below H/H stable.No tarry or black stool  - advised to avoid eating meals late in the evening and to avoid aggravating foods and spices. - omeprazole (PRILOSEC) 20 MG capsule; Take 1 capsule (20 mg total) by mouth daily.  Dispense: 30 capsule; Refill: 3 -Additional educational material on GERD provided on AVS  Family/  staff Communication: Reviewed plan of care with patient verbalized understanding  Labs/tests ordered:  - EKG 12-Lead   Next Appointment: Return if symptoms worsen or  fail to improve.   Sandrea Hughs, NP

## 2022-04-10 ENCOUNTER — Ambulatory Visit: Payer: Medicare Other

## 2022-04-17 ENCOUNTER — Ambulatory Visit
Admission: RE | Admit: 2022-04-17 | Discharge: 2022-04-17 | Disposition: A | Discharge status: Home / Self Care | Payer: Medicare Other | Source: Ambulatory Visit | Attending: Family | Admitting: Family

## 2022-04-17 DIAGNOSIS — Z1231 Encounter for screening mammogram for malignant neoplasm of breast: Secondary | ICD-10-CM

## 2022-06-18 ENCOUNTER — Encounter: Payer: Medicare Other | Admitting: Family

## 2022-06-19 ENCOUNTER — Ambulatory Visit (INDEPENDENT_AMBULATORY_CARE_PROVIDER_SITE_OTHER): Payer: Medicare Other | Admitting: Family

## 2022-06-19 VITALS — Wt 150.0 lb

## 2022-06-19 DIAGNOSIS — Z0000 Encounter for general adult medical examination without abnormal findings: Secondary | ICD-10-CM | POA: Diagnosis not present

## 2022-06-19 NOTE — Progress Notes (Signed)
Subjective:   Laurie Decker is a 76 y.o. female who presents for Medicare Annual (Subsequent) preventive examination.  Review of Systems     Cardiac Risk Factors include: advanced age (>63mn, >>21women);hypertension;smoking/ tobacco exposure     Objective:    Today's Vitals   06/19/22 1338  Weight: 150 lb (68 kg)   Body mass index is 26.57 kg/m.     03/19/2022    9:44 AM 12/28/2021    4:51 PM 06/15/2021   11:20 AM 12/26/2020   12:51 PM 06/23/2020   11:20 AM 06/09/2020    9:02 AM 12/17/2019    9:57 AM  Advanced Directives  Does Patient Have a Medical Advance Directive? Yes Yes Yes Yes Yes No Yes  Type of [place]aramed[place];Out of facility DNR (pink MOST or yellow form) [place]Living will;Out of facility DNR (pink MOST or yellow form) Living will;Healthcare Power of [place]ib[place]Living will Living will  [place]Living will  Does patient want to make changes to medical advance directive? No - Patient declined No - Patient declined No - Patient declined No - Patient declined No - Patient declined  No - Patient declined  Copy of [place] Chart? No - copy requested No - copy requested No - copy requested No - copy requested   No - copy requested  Would patient like information on creating a medical advance directive?   No - Patient declined   No - Patient declined     Current Medications (verified) Outpatient Encounter Medications as of 06/19/2022  Medication Sig   Flaxseed, Linseed, (FLAXSEED OIL) 1000 MG CAPS Take by mouth daily.   lisinopril (ZESTRIL) 20 MG tablet Take 1 tablet (20 mg total) by mouth daily.   Lysine HCl 1000 MG TABS Take by mouth daily.   Magnesium 100 MG CAPS Take 1 capsule by mouth once a week.   Multiple Vitamins-Minerals (CENTRUM ADULTS PO) Take by mouth daily.   naproxen sodium (ALEVE) 220 MG tablet Take 220 mg by mouth as needed.    valACYclovir (VALTREX) 1000 MG tablet Take 2 tablets (2,000 mg total) by mouth as needed (for cold sore).   [DISCONTINUED] omeprazole (PRILOSEC) 20 MG capsule Take 1 capsule (20 mg total) by mouth daily.   Calcium Citrate-Vitamin D (CALCIUM CITRATE + D3) 200-250 MG-UNIT TABS Take 1 tablet by mouth daily.   No facility-administered encounter medications on file as of 06/19/2022.    Allergies (verified) Latex   History: Past Medical History:  Diagnosis Date   High blood pressure    Hyperlipidemia    Per previous Cardiology Record    Impaired fasting glucose    Per previous Cardiology records    Macrocytosis 04/02/2016   Per previous Cardiology records    Osteopenia 04/02/2016   Per previous Cardiology records    Past Surgical History:  Procedure Laterality Date   APPENDECTOMY     APPENDECTOMY  1977   BREAST BIOPSY  03/07/2014   Benign   BUNIONECTOMY  2000   COLONOSCOPY  10/07/2012   Per previous Cardiology records    TONSILLECTOMY AND ADENOIDECTOMY  10/08/1955   Per previous Cardiology records    Family History  Problem Relation Age of Onset   Heart disease Mother    Alcoholism Father    Colon cancer Father        malignant tumor of colon    Social History   Socioeconomic  History   Marital status: Married    Spouse name: Not on file   Number of children: 2   Years of education: 47   Highest education level: Not on file  Occupational History   Occupation: retired  Tobacco Use   Smoking status: Former    Packs/day: 3.00    Types: Cigarettes   Smokeless tobacco: Never   Tobacco comments:    Quit in 20s  Vaping Use   Vaping Use: Never used  Substance and Sexual Activity   Alcohol use: Yes    Alcohol/week: 2.0 standard drinks of alcohol    Types: 2 Shots of liquor per week    Comment: 2 vodka shots  weekly   Drug use: No   Sexual activity: Not on file  Other Topics Concern   Not on file  Social History Narrative   Diet: N/A      Caffeine: 1 cup of  coffee daily      Married, if yes what year: 2014      Do you live in a house, apartment, assisted living, condo, trailer, ect: House, 1 stories, 2 persons       Pets: 1 dog       Current/Past profession: Nursing School       Exercise: Yes, daily   rigth handed      Living Will:  Yes   DNR: Yes   POA/HPOA: Yes       Functional Status:   Do you have difficulty bathing or dressing yourself? No   Do you have difficulty preparing food or eating? No   Do you have difficulty managing your medications? No   Do you have difficulty managing your finances? No   Do you have difficulty affording your medications? No   Social Determinants of Health   Financial Resource Strain: Low Risk  (01/16/2018)   Overall Financial Resource Strain (CARDIA)    Difficulty of Paying Living Expenses: Not hard at all  Food Insecurity: No Food Insecurity (01/16/2018)   Hunger Vital Sign    Worried About Running Out of Food in the Last Year: Never true    Ran Out of Food in the Last Year: Never true  Transportation Needs: No Transportation Needs (01/16/2018)   PRAPARE - Hydrologist (Medical): No    Lack of Transportation (Non-Medical): No  Physical Activity: Inactive (01/16/2018)   Exercise Vital Sign    Days of Exercise per Week: 0 days    Minutes of Exercise per Session: 0 min  Stress: No Stress Concern Present (01/16/2018)   [place]    Feeling of Stress : Only a little  Social Connections: Moderately Integrated (01/16/2018)   Social Connection and Isolation Panel [NHANES]    Frequency of Communication with Friends and Family: Twice a week    Frequency of Social Gatherings with Friends and Family: Twice a week    Attends Religious Services: Never    Marine scientist or Organizations: Yes    Attends Music therapist: More than 4 times per year    Marital Status: Married    Tobacco  Counseling Counseling given: Not Answered Tobacco comments: Quit in 20s   Clinical Intake:  Pre-visit preparation completed: No  Pain : No/denies pain     BMI - recorded: 27.28 Nutritional Status: BMI 25 -29 Overweight Nutritional Risks: None Diabetes: No  How often do you need to have someone help  you when you read instructions, pamphlets, or other written materials from your doctor or pharmacy?: 1 - Never What is the last grade level you completed in school?: Associate degree  Diabetic?No   Interpreter Needed?: No      Activities of Daily Living    06/19/2022    2:55 PM 06/19/2022    1:38 PM  In your present state of health, do you have any difficulty performing the following activities:  Hearing? 0 0  Vision? 0 0  Difficulty concentrating or making decisions? 0 0  Walking or climbing stairs? 0 0  Dressing or bathing? 0 0  Doing errands, shopping? 0 0  Preparing Food and eating ? N   Using the Toilet? N   In the past six months, have you accidently leaked urine? Y   Comment sometimes   Do you have problems with loss of bowel control? N   Managing your Medications? N   Managing your Finances? N   Housekeeping or managing your Housekeeping? N     Patient Care Team: Harmon Bommarito, Nelda Bucks, NP as PCP - General (Family Medicine) Alda Berthold, DO as Consulting Physician (Neurology)  Indicate any recent Medical Services you may have received from other than [facility] providers in the past year (date may be approximate).     Assessment:   This is a routine wellness examination for [place].  Hearing/Vision screen No results found.  Dietary issues and exercise activities discussed: Current Exercise Habits: Home exercise routine, Type of exercise: walking, Time (Minutes): 30, Frequency (Times/Week): 1, Weekly Exercise (Minutes/Week): 30, Intensity: Moderate, Exercise limited by: None identified   Goals Addressed             This Visit's Progress    Patient Stated    Not on track    Patient will start doing water aerobics       Depression Screen    06/19/2022    1:35 PM 06/15/2021   11:16 AM 12/26/2020   12:50 PM 06/09/2020    8:58 AM 12/17/2019    9:57 AM 02/10/2019    1:11 PM 09/08/2018    9:23 AM  PHQ 2/9 Scores  PHQ - 2 Score 0 0 0 0 0 0 0    Fall Risk    06/19/2022    1:35 PM 03/19/2022    9:44 AM 12/28/2021    4:51 PM 06/28/2021    8:18 AM 06/15/2021   11:16 AM  Walker in the past year? 0 0 0 0 0  Number falls in past yr: 0 0 0 0 0  Injury with Fall? 0 0 0 0 0  Risk for fall due to : No Fall Risks No Fall Risks No Fall Risks History of fall(s) No Fall Risks  Follow up Falls evaluation completed Falls evaluation completed Falls evaluation completed Falls evaluation completed;Education provided;Falls prevention discussed Falls evaluation completed    FALL RISK PREVENTION PERTAINING TO THE HOME:  Any stairs in or around the home? Yes  If so, are there any without handrails? No  Home free of loose throw rugs in walkways, pet beds, electrical cords, etc? No  Adequate lighting in your home to reduce risk of falls? Yes   ASSISTIVE DEVICES UTILIZED TO PREVENT FALLS:  Life alert? Yes  Use of a cane, walker or w/c? No  Grab bars in the bathroom? Yes  Shower chair or bench in shower? Yes  Elevated toilet seat or a handicapped toilet? Yes  TIMED UP AND GO:  Was the test performed? No .  Length of time to ambulate 10 feet: N/A sec.   Gait steady and fast without use of assistive device  Cognitive Function:    01/16/2018   10:43 AM  MMSE - Mini Mental State Exam  Orientation to time 5  Orientation to Place 5  Registration 3  Attention/ Calculation 5  Recall 2  Language- name 2 objects 2  Language- repeat 1  Language- follow 3 step command 3  Language- read & follow direction 1  Write a sentence 1  Copy design 1  Total score 29        06/19/2022    1:36 PM 06/15/2021   11:17 AM 06/09/2020    8:59 AM  6CIT Screen   What Year? 0 points 0 points 0 points  What month? 0 points 0 points 0 points  What time? 0 points 0 points 0 points  Count back from 20 0 points 0 points 0 points  Months in reverse 0 points 0 points 0 points  Repeat phrase 0 points 0 points 0 points  Total Score 0 points 0 points 0 points    Immunizations Immunization History  Administered Date(s) Administered   Hepatitis A 04/26/2004, 12/14/2004   Hepatitis B, adult 10/07/2005   IPV 07/15/2008   Td 07/21/1976, 02/03/2004   Tdap 07/15/2008   Tetanus 07/22/2018   Yellow Fever 02/03/2004    TDAP status: Up to date  Flu Vaccine status: Declined, Education has been provided regarding the importance of this vaccine but patient still declined. Advised may receive this vaccine at local pharmacy or Health Dept. Aware to provide a copy of the vaccination record if obtained from local pharmacy or Health Dept. Verbalized acceptance and understanding.  Pneumococcal vaccine status: Declined,  Education has been provided regarding the importance of this vaccine but patient still declined. Advised may receive this vaccine at local pharmacy or Health Dept. Aware to provide a copy of the vaccination record if obtained from local pharmacy or Health Dept. Verbalized acceptance and understanding.   Covid-19 vaccine status: Declined, Education has been provided regarding the importance of this vaccine but patient still declined. Advised may receive this vaccine at local pharmacy or Health Dept.or vaccine clinic. Aware to provide a copy of the vaccination record if obtained from local pharmacy or Health Dept. Verbalized acceptance and understanding.  Qualifies for Shingles Vaccine? Yes   Zostavax completed No   Shingrix Completed?: No.    Education has been provided regarding the importance of this vaccine. Patient has been advised to call insurance company to determine out of pocket expense if they have not yet received this vaccine. Advised may also  receive vaccine at local pharmacy or Health Dept. Verbalized acceptance and understanding.  Screening Tests Health Maintenance  Topic Date Due   COLONOSCOPY (Pts 45-68yr Insurance coverage will need to be confirmed)  03/11/2022   TETANUS/TDAP  07/22/2028   DEXA SCAN  Completed   Hepatitis C Screening  Completed   HPV VACCINES  Aged Out   Pneumonia Vaccine 76 Years old  Discontinued   INFLUENZA VACCINE  Discontinued   COVID-19 Vaccine  Discontinued   Zoster Vaccines- Shingrix  Discontinued    Health Maintenance  Health Maintenance Due  Topic Date Due   COLONOSCOPY (Pts 45-429yrInsurance coverage will need to be confirmed)  03/11/2022    Colorectal cancer screening: No longer required.   Mammogram status: No longer required due to advance age.  Bone Density status: Completed 06/20/2021. Results reflect: Bone density results: OSTEOPENIA. Repeat every 5 years.  Lung Cancer Screening: (Low Dose CT Chest recommended if Age 63-80 years, 30 pack-year currently smoking OR have quit w/in 15years.) does not qualify.   Lung Cancer Screening Referral: NO   Additional Screening:  Hepatitis C Screening: does qualify; Completed yes   Vision Screening: Recommended annual ophthalmology exams for early detection of glaucoma and other disorders of the eye. Is the patient up to date with their annual eye exam?  Yes  Who is the provider or what is the name of the office in which the patient attends annual eye exams? Dr.Shapiro  If pt is not established with a provider, would they like to be referred to a provider to establish care? No .   Dental Screening: Recommended annual dental exams for proper oral hygiene  Community Resource Referral / Chronic Care Management: CRR required this visit?  No   CCM required this visit?  No      Plan:     I have personally reviewed and noted the following in the patient's chart:   Medical and social history Use of alcohol, tobacco or illicit  drugs  Current medications and supplements including opioid prescriptions. Patient is not currently taking opioid prescriptions. Functional ability and status Nutritional status Physical activity Advanced directives List of other physicians Hospitalizations, surgeries, and ER visits in previous 12 months Vitals Screenings to include cognitive, depression, and falls Referrals and appointments  In addition, I have reviewed and discussed with patient certain preventive protocols, quality metrics, and best practice recommendations. A written personalized care plan for preventive services as well as general preventive health recommendations were provided to patient.     Sandrea Hughs, NP   06/19/2022   Nurse Notes: Declines all vaccines due to possible autoimmune reaction.

## 2022-06-19 NOTE — Patient Instructions (Signed)
Laurie Decker , Thank you for taking time to come for your Medicare Wellness Visit. I appreciate your ongoing commitment to your health goals. Please review the following plan we discussed and let me know if I can assist you in the future.   Screening recommendations/referrals: Colonoscopy Mammogram  Bone Density : Up to date  Recommended yearly ophthalmology/optometry visit for glaucoma screening and checkup Recommended yearly dental visit for hygiene and checkup  Vaccinations: Influenza vaccine- due annually in September/October Pneumococcal vaccine : N/A  Tdap vaccine : N/A Shingles vaccine : N/a     Advanced directives: Yes   Conditions/risks identified: Advance Age female > 44 yrs,Hypertension and Hx of smoking   Next appointment: 1 year    Preventive Care 76 Years and Older, Female Preventive care refers to lifestyle choices and visits with your health care provider that can promote health and wellness. What does preventive care include? A yearly physical exam. This is also called an annual well check. Dental exams once or twice a year. Routine eye exams. Ask your health care provider how often you should have your eyes checked. Personal lifestyle choices, including: Daily care of your teeth and gums. Regular physical activity. Eating a healthy diet. Avoiding tobacco and drug use. Limiting alcohol use. Practicing safe sex. Taking low-dose aspirin every day. Taking vitamin and mineral supplements as recommended by your health care provider. What happens during an annual well check? The services and screenings done by your health care provider during your annual well check will depend on your age, overall health, lifestyle risk factors, and family history of disease. Counseling  Your health care provider may ask you questions about your: Alcohol use. Tobacco use. Drug use. Emotional well-being. Home and relationship well-being. Sexual activity. Eating  habits. History of falls. Memory and ability to understand (cognition). Work and work Statistician. Reproductive health. Screening  You may have the following tests or measurements: Height, weight, and BMI. Blood pressure. Lipid and cholesterol levels. These may be checked every 5 years, or more frequently if you are over 59 years old. Skin check. Lung cancer screening. You may have this screening every year starting at age 5 if you have a 30-pack-year history of smoking and currently smoke or have quit within the past 15 years. Fecal occult blood test (FOBT) of the stool. You may have this test every year starting at age 66. Flexible sigmoidoscopy or colonoscopy. You may have a sigmoidoscopy every 5 years or a colonoscopy every 10 years starting at age 56. Hepatitis C blood test. Hepatitis B blood test. Sexually transmitted disease (STD) testing. Diabetes screening. This is done by checking your blood sugar (glucose) after you have not eaten for a while (fasting). You may have this done every 1-3 years. Bone density scan. This is done to screen for osteoporosis. You may have this done starting at age 75. Mammogram. This may be done every 1-2 years. Talk to your health care provider about how often you should have regular mammograms. Talk with your health care provider about your test results, treatment options, and if necessary, the need for more tests. Vaccines  Your health care provider may recommend certain vaccines, such as: Influenza vaccine. This is recommended every year. Tetanus, diphtheria, and acellular pertussis (Tdap, Td) vaccine. You may need a Td booster every 10 years. Zoster vaccine. You may need this after age 48. Pneumococcal 13-valent conjugate (PCV13) vaccine. One dose is recommended after age 29. Pneumococcal polysaccharide (PPSV23) vaccine. One dose is recommended after age  87. Talk to your health care provider about which screenings and vaccines you need and how  often you need them. This information is not intended to replace advice given to you by your health care provider. Make sure you discuss any questions you have with your health care provider. Document Released: 10/20/2015 Document Revised: 06/12/2016 Document Reviewed: 07/25/2015 Elsevier Interactive Patient Education  2017 [place] Prevention in the Home Falls can cause injuries. They can happen to people of all ages. There are many things you can do to make your home safe and to help prevent falls. What can I do on the outside of my home? Regularly fix the edges of walkways and driveways and fix any cracks. Remove anything that might make you trip as you walk through a door, such as a raised step or threshold. Trim any bushes or trees on the path to your home. Use bright outdoor lighting. Clear any walking paths of anything that might make someone trip, such as rocks or tools. Regularly check to see if handrails are loose or broken. Make sure that both sides of any steps have handrails. Any raised decks and porches should have guardrails on the edges. Have any leaves, snow, or ice cleared regularly. Use sand or salt on walking paths during winter. Clean up any spills in your garage right away. This includes oil or grease spills. What can I do in the bathroom? Use night lights. Install grab bars by the toilet and in the tub and shower. Do not use towel bars as grab bars. Use non-skid mats or decals in the tub or shower. If you need to sit down in the shower, use a plastic, non-slip stool. Keep the floor dry. Clean up any water that spills on the floor as soon as it happens. Remove soap buildup in the tub or shower regularly. Attach bath mats securely with double-sided non-slip rug tape. Do not have throw rugs and other things on the floor that can make you trip. What can I do in the bedroom? Use night lights. Make sure that you have a light by your bed that is easy to  reach. Do not use any sheets or blankets that are too big for your bed. They should not hang down onto the floor. Have a firm chair that has side arms. You can use this for support while you get dressed. Do not have throw rugs and other things on the floor that can make you trip. What can I do in the kitchen? Clean up any spills right away. Avoid walking on wet floors. Keep items that you use a lot in easy-to-reach places. If you need to reach something above you, use a strong step stool that has a grab bar. Keep electrical cords out of the way. Do not use floor polish or wax that makes floors slippery. If you must use wax, use non-skid floor wax. Do not have throw rugs and other things on the floor that can make you trip. What can I do with my stairs? Do not leave any items on the stairs. Make sure that there are handrails on both sides of the stairs and use them. Fix handrails that are broken or loose. Make sure that handrails are as long as the stairways. Check any carpeting to make sure that it is firmly attached to the stairs. Fix any carpet that is loose or worn. Avoid having throw rugs at the top or bottom of the stairs. If you do have  throw rugs, attach them to the floor with carpet tape. Make sure that you have a light switch at the top of the stairs and the bottom of the stairs. If you do not have them, ask someone to add them for you. What else can I do to help prevent falls? Wear shoes that: Do not have high heels. Have rubber bottoms. Are comfortable and fit you well. Are closed at the toe. Do not wear sandals. If you use a stepladder: Make sure that it is fully opened. Do not climb a closed stepladder. Make sure that both sides of the stepladder are locked into place. Ask someone to hold it for you, if possible. Clearly mark and make sure that you can see: Any grab bars or handrails. First and last steps. Where the edge of each step is. Use tools that help you move  around (mobility aids) if they are needed. These include: Canes. Walkers. Scooters. Crutches. Turn on the lights when you go into a dark area. Replace any light bulbs as soon as they burn out. Set up your furniture so you have a clear path. Avoid moving your furniture around. If any of your floors are uneven, fix them. If there are any pets around you, be aware of where they are. Review your medicines with your doctor. Some medicines can make you feel dizzy. This can increase your chance of falling. Ask your doctor what other things that you can do to help prevent falls. This information is not intended to replace advice given to you by your health care provider. Make sure you discuss any questions you have with your health care provider. Document Released: 07/20/2009 Document Revised: 02/29/2016 Document Reviewed: 10/28/2014 Elsevier Interactive Patient Education  2017 [employer].

## 2022-06-24 ENCOUNTER — Other Ambulatory Visit: Payer: Medicare Other

## 2022-06-24 DIAGNOSIS — E782 Mixed hyperlipidemia: Secondary | ICD-10-CM | POA: Diagnosis not present

## 2022-06-24 DIAGNOSIS — I10 Essential (primary) hypertension: Secondary | ICD-10-CM | POA: Diagnosis not present

## 2022-06-24 DIAGNOSIS — E663 Overweight: Secondary | ICD-10-CM

## 2022-06-24 LAB — CBC WITH DIFFERENTIAL/PLATELET
Absolute Monocytes: 515 cells/uL (ref 200–950)
Basophils Absolute: 22 cells/uL (ref 0–200)
Basophils Relative: 0.5 %
Eosinophils Absolute: 180 cells/uL (ref 15–500)
Eosinophils Relative: 4.1 %
HCT: 41.5 % (ref 35.0–45.0)
Hemoglobin: 14.4 g/dL (ref 11.7–15.5)
Lymphs Abs: 1364 cells/uL (ref 850–3900)
MCH: 33.3 pg — ABNORMAL HIGH (ref 27.0–33.0)
MCHC: 34.7 g/dL (ref 32.0–36.0)
MCV: 96.1 fL (ref 80.0–100.0)
MPV: 10.8 fL (ref 7.5–12.5)
Monocytes Relative: 11.7 %
Neutro Abs: 2319 cells/uL (ref 1500–7800)
Neutrophils Relative %: 52.7 %
Platelets: 188 10*3/uL (ref 140–400)
RBC: 4.32 10*6/uL (ref 3.80–5.10)
RDW: 12.8 % (ref 11.0–15.0)
Total Lymphocyte: 31 %
WBC: 4.4 10*3/uL (ref 3.8–10.8)

## 2022-06-24 LAB — LIPID PANEL
Cholesterol: 228 mg/dL — ABNORMAL HIGH (ref <200)
HDL: 82 mg/dL (ref >=50)
LDL Cholesterol (Calc): 122 mg/dL (calc) — ABNORMAL HIGH
Non-HDL Cholesterol (Calc): 146 mg/dL (calc) — ABNORMAL HIGH (ref <130)
Total CHOL/HDL Ratio: 2.8 (calc) (ref <5.0)
Triglycerides: 125 mg/dL (ref <150)

## 2022-06-24 LAB — COMPLETE METABOLIC PANEL WITH GFR
AG Ratio: 1.8 (calc) (ref 1.0–2.5)
ALT: 13 U/L (ref 6–29)
AST: 23 U/L (ref 10–35)
Albumin: 4.5 g/dL (ref 3.6–5.1)
Alkaline phosphatase (APISO): 57 U/L (ref 37–153)
BUN: 25 mg/dL (ref 7–25)
CO2: 26 mmol/L (ref 20–32)
Calcium: 9.6 mg/dL (ref 8.6–10.4)
Chloride: 99 mmol/L (ref 98–110)
Creat: 0.83 mg/dL (ref 0.60–1.00)
Globulin: 2.5 g/dL (calc) (ref 1.9–3.7)
Glucose, Bld: 92 mg/dL (ref 65–99)
Potassium: 4.5 mmol/L (ref 3.5–5.3)
Sodium: 137 mmol/L (ref 135–146)
Total Bilirubin: 0.8 mg/dL (ref 0.2–1.2)
Total Protein: 7 g/dL (ref 6.1–8.1)
eGFR: 73 mL/min/{1.73_m2} (ref >=60)

## 2022-07-03 ENCOUNTER — Ambulatory Visit (INDEPENDENT_AMBULATORY_CARE_PROVIDER_SITE_OTHER): Payer: Medicare Other | Admitting: Family

## 2022-07-03 ENCOUNTER — Encounter: Payer: Self-pay | Admitting: Family

## 2022-07-03 VITALS — BP 120/84 | HR 74 | Temp 97.2°F | Resp 16 | Ht 63.0 in | Wt 153.4 lb

## 2022-07-03 DIAGNOSIS — I10 Essential (primary) hypertension: Secondary | ICD-10-CM

## 2022-07-03 DIAGNOSIS — E782 Mixed hyperlipidemia: Secondary | ICD-10-CM | POA: Diagnosis not present

## 2022-07-03 DIAGNOSIS — K219 Gastro-esophageal reflux disease without esophagitis: Secondary | ICD-10-CM

## 2022-07-03 DIAGNOSIS — Z8619 Personal history of other infectious and parasitic diseases: Secondary | ICD-10-CM | POA: Diagnosis not present

## 2022-07-03 NOTE — Patient Instructions (Signed)
-   check Blood pressure at home and record and notify provider if B/p > 140/90

## 2022-07-03 NOTE — Progress Notes (Signed)
Provider: [name] FNP-C   Kechia Yahnke, Nelda Bucks, NP  Patient Care Team: Arlyce Circle, Nelda Bucks, NP as PCP - General (Family Medicine) Alda Berthold, DO as Consulting Physician (Neurology)  Extended Emergency Contact Information Primary Emergency Contact: Ritchie,Rob Mobile Phone: [phone] Relation: Son  Code Status: Full code Goals of care: Advanced Directive information    07/03/2022    9:04 AM  Advanced Directives  Does Patient Have a Medical Advance Directive? Yes  Type of Paramedic of [place];Living will;Out of facility DNR (pink MOST or yellow form)  Does patient want to make changes to medical advance directive? No - Patient declined  Copy of [place] in Chart? No - copy requested     Chief Complaint  Patient presents with   Medical Management of Chronic Issues    6 month follow up.    Health Maintenance    Discuss the need for Colonoscopy.    Review Labs     Review recent labs.    HPI:  Pt is a 76 y.o. female seen today for 69-monthfollow-up for medical management of chronic diseases.  Recent lab results reviewed and discussed during visit.  Labs unremarkable except for the following: Total cholesterol 228 and LDL cholesterol 122 are high.Has been eating cheese daily.   Lost her dog one week ago who has been with for 16 yrs   Hypertension - Blood pressure has been normal at home.   Weight up 3 lbs since she has not been exercising but will restart walking around the neighborwith recent adaopted dog.   Has not had any eruption of herpes simplex.  Due for screening colonoscopy  Past Medical History:  Diagnosis Date   High blood pressure    Hyperlipidemia    Per previous Cardiology Record    Impaired fasting glucose    Per previous Cardiology records    Macrocytosis 04/02/2016   Per previous Cardiology records    Osteopenia 04/02/2016   Per previous Cardiology records    Past Surgical History:   Procedure Laterality Date   APPENDECTOMY     APPENDECTOMY  1977   BREAST BIOPSY  03/07/2014   Benign   BUNIONECTOMY  2000   COLONOSCOPY  10/07/2012   Per previous Cardiology records    TONSILLECTOMY AND ADENOIDECTOMY  10/08/1955   Per previous Cardiology records     Allergies  Allergen Reactions   Latex     Allergies as of 07/03/2022       Reactions   Latex         Medication List        Accurate as of July 03, 2022  9:06 AM. If you have any questions, ask your nurse or doctor.          Calcium Citrate + D3 200-6.25 MG-MCG Tabs Generic drug: Calcium Citrate-Vitamin D Take 1 tablet by mouth daily.   CENTRUM ADULTS PO Take by mouth daily.   Flaxseed Oil 1000 MG Caps Take by mouth daily.   lisinopril 20 MG tablet Commonly known as: ZESTRIL Take 1 tablet (20 mg total) by mouth daily.   Lysine HCl 1000 MG Tabs Take by mouth daily.   Magnesium 100 MG Caps Take 1 capsule by mouth once a week.   naproxen sodium 220 MG tablet Commonly known as: ALEVE Take 220 mg by mouth as needed.   valACYclovir 1000 MG tablet Commonly known as: Valtrex Take 2 tablets (2,000 mg total) by mouth as needed (  for cold sore).        Review of Systems  Constitutional:  Negative for appetite change, chills, fatigue, fever and unexpected weight change.  HENT:  Negative for congestion, dental problem, ear discharge, ear pain, facial swelling, hearing loss, nosebleeds, postnasal drip, rhinorrhea, sinus pressure, sinus pain, sneezing, sore throat, tinnitus and trouble swallowing.   Eyes:  Negative for pain, discharge, redness, itching and visual disturbance.  Respiratory:  Negative for cough, chest tightness, shortness of breath and wheezing.   Cardiovascular:  Negative for chest pain, palpitations and leg swelling.  Gastrointestinal:  Negative for abdominal distention, abdominal pain, blood in stool, constipation, diarrhea, nausea and vomiting.  Endocrine: Negative for  cold intolerance, heat intolerance, polydipsia, polyphagia and polyuria.  Genitourinary:  Negative for difficulty urinating, dysuria, flank pain, frequency and urgency.  Musculoskeletal:  Negative for arthralgias, back pain, gait problem, joint swelling, myalgias, neck pain and neck stiffness.  Skin:  Negative for color change, pallor, rash and wound.  Neurological:  Negative for dizziness, syncope, speech difficulty, weakness, light-headedness, numbness and headaches.  Hematological:  Does not bruise/bleed easily.  Psychiatric/Behavioral:  Negative for agitation, behavioral problems, confusion, hallucinations, self-injury, sleep disturbance and suicidal ideas. The patient is not nervous/anxious.     Immunization History  Administered Date(s) Administered   Hepatitis A 04/26/2004, 12/14/2004   Hepatitis B, adult 10/07/2005   IPV 07/15/2008   Td 07/21/1976, 02/03/2004   Tdap 07/15/2008   Tetanus 07/22/2018   Yellow Fever 02/03/2004   Pertinent  Health Maintenance Due  Topic Date Due   COLONOSCOPY (Pts 45-51yr Insurance coverage will need to be confirmed)  03/11/2022   DEXA SCAN  Completed   INFLUENZA VACCINE  Discontinued      06/28/2021    8:18 AM 12/28/2021    4:51 PM 03/19/2022    9:44 AM 06/19/2022    1:35 PM 07/03/2022    9:04 AM  Fall Risk  Falls in the past year? 0 0 0 0 0  Was there an injury with Fall? 0 0 0 0 0  Fall Risk Category Calculator 0 0 0 0 0  Fall Risk Category Low Low Low Low Low  Patient Fall Risk Level Low fall risk Low fall risk Low fall risk Low fall risk Low fall risk  Patient at Risk for Falls Due to History of fall(s) No Fall Risks No Fall Risks No Fall Risks No Fall Risks  Fall risk Follow up Falls evaluation completed;Education provided;Falls prevention discussed Falls evaluation completed Falls evaluation completed Falls evaluation completed Falls evaluation completed   Functional Status Survey:    Vitals:   07/03/22 0902  BP: 120/84  Pulse: 74   Resp: 16  Temp: (!) 97.2 F (36.2 C)  SpO2: 97%  Weight: 153 lb 6.4 oz (69.6 kg)  Height: '5\' 3"'  (1.6 m)   Body mass index is 27.17 kg/m. Physical Exam Vitals reviewed.  Constitutional:      General: She is not in acute distress.    Appearance: Normal appearance. She is overweight. She is not ill-appearing or diaphoretic.  HENT:     Head: Normocephalic.     Right Ear: Tympanic membrane, ear canal and external ear normal. There is no impacted cerumen.     Left Ear: Tympanic membrane, ear canal and external ear normal. There is no impacted cerumen.     Nose: Nose normal. No congestion or rhinorrhea.     Mouth/Throat:     Mouth: Mucous membranes are moist.  Pharynx: Oropharynx is clear. No oropharyngeal exudate or posterior oropharyngeal erythema.  Eyes:     General: No scleral icterus.       Right eye: No discharge.        Left eye: No discharge.     Extraocular Movements: Extraocular movements intact.     Conjunctiva/sclera: Conjunctivae normal.     Pupils: Pupils are equal, round, and reactive to light.  Neck:     Vascular: No carotid bruit.  Cardiovascular:     Rate and Rhythm: Normal rate and regular rhythm.     Pulses: Normal pulses.     Heart sounds: Normal heart sounds. No murmur heard.    No friction rub. No gallop.  Pulmonary:     Effort: Pulmonary effort is normal. No respiratory distress.     Breath sounds: Normal breath sounds. No wheezing, rhonchi or rales.  Chest:     Chest wall: No tenderness.  Abdominal:     General: Bowel sounds are normal. There is no distension.     Palpations: Abdomen is soft. There is no mass.     Tenderness: There is no abdominal tenderness. There is no right CVA tenderness, left CVA tenderness, guarding or rebound.  Musculoskeletal:        General: No swelling or tenderness. Normal range of motion.     Cervical back: Normal range of motion. No rigidity or tenderness.     Right lower leg: No edema.     Left lower leg: No edema.   Lymphadenopathy:     Cervical: No cervical adenopathy.  Skin:    General: Skin is warm and dry.     Coloration: Skin is not pale.     Findings: No bruising, erythema, lesion or rash.  Neurological:     Mental Status: She is alert and oriented to person, place, and time.     Cranial Nerves: No cranial nerve deficit.     Sensory: No sensory deficit.     Motor: No weakness.     Coordination: Coordination normal.     Gait: Gait normal.  Psychiatric:        Mood and Affect: Mood normal.        Speech: Speech normal.        Behavior: Behavior normal.        Thought Content: Thought content normal.        Judgment: Judgment normal.    Labs reviewed: Recent Labs    12/24/21 0813 06/24/22 0856  NA 138 137  K 4.7 4.5  CL 98 99  CO2 31 26  GLUCOSE 103* 92  BUN 20 25  CREATININE 0.88 0.83  CALCIUM 10.2 9.6   Recent Labs    12/24/21 0813 06/24/22 0856  AST 14 23  ALT 11 13  BILITOT 0.6 0.8  PROT 6.8 7.0   Recent Labs    12/24/21 0813 06/24/22 0856  WBC 6.7 4.4  NEUTROABS 4,194 2,319  HGB 13.9 14.4  HCT 41.1 41.5  MCV 98.6 96.1  PLT 237 188   Lab Results  Component Value Date   TSH 3.76 12/24/2021   Lab Results  Component Value Date   HGBA1C 5.3 12/24/2021   Lab Results  Component Value Date   CHOL 228 (H) 06/24/2022   HDL 82 06/24/2022   LDLCALC 122 (H) 06/24/2022   TRIG 125 06/24/2022   CHOLHDL 2.8 06/24/2022    Significant Diagnostic Results in last 30 days:  No results found.  Assessment/Plan 1.  Essential hypertension Blood pressure well controlled Continue on lisinopril - TSH; Future - COMPLETE METABOLIC PANEL WITH GFR; Future - CBC with Differential/Platelet; Future  2. Mixed hyperlipidemia LDL not at goal 122 declines statin -Dietary modification and exercise advised -Continue on flaxseed - Lipid panel; Future  3. Gastroesophageal reflux disease without esophagitis Symptoms controlled Has not required any antiacid's.  4. 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 Has Valtrex in case of outbreak.  Family/ staff Communication: Reviewed plan of care with patient verbalized understanding  Labs/tests ordered:  - CBC with Differential/Platelet - CMP with eGFR([facility]) - TSH - Lipid panel  Next Appointment : Return in about 6 months (around 01/01/2023) for Fasting labs in 6 months prior to visit, medical mangement of chronic issues.Sandrea Hughs, NP

## 2022-08-30 ENCOUNTER — Other Ambulatory Visit: Payer: Self-pay | Admitting: Family

## 2022-08-30 DIAGNOSIS — I10 Essential (primary) hypertension: Secondary | ICD-10-CM

## 2022-12-04 ENCOUNTER — Encounter: Payer: Self-pay | Admitting: Family

## 2022-12-04 ENCOUNTER — Ambulatory Visit (INDEPENDENT_AMBULATORY_CARE_PROVIDER_SITE_OTHER): Payer: Medicare Other | Admitting: Family

## 2022-12-04 VITALS — BP 152/90 | HR 78 | Temp 97.3°F | Resp 16 | Ht 63.0 in | Wt 152.0 lb

## 2022-12-04 DIAGNOSIS — I10 Essential (primary) hypertension: Secondary | ICD-10-CM

## 2022-12-04 MED ORDER — HYDROCHLOROTHIAZIDE 12.5 MG PO TABS: 12.5000 mg | ORAL_TABLET | Freq: Every day | ORAL | 3 refills | Status: DC

## 2022-12-04 MED ORDER — LISINOPRIL 20 MG PO TABS: 20.0000 mg | ORAL_TABLET | Freq: Every day | ORAL | 1 refills | Status: DC

## 2022-12-04 NOTE — Progress Notes (Signed)
Provider: [name] FNP-C   Trevin Gartrell, Nelda Bucks, NP  Patient Care Team: Stephenia Vogan, Nelda Bucks, NP as PCP - General (Family Medicine) Alda Berthold, DO as Consulting Physician (Neurology)  Extended Emergency Contact Information Primary Emergency Contact: Ritchie,Rob Mobile Phone: [phone] Relation: Son  Code Status:  Full Code  Goals of care: Advanced Directive information    07/03/2022    9:04 AM  Advanced Directives  Does Patient Have a Medical Advance Directive? Yes  Type of Paramedic of [place];Living will;Out of facility DNR (pink MOST or yellow form)  Does patient want to make changes to medical advance directive? No - Patient declined  Copy of Ila in Chart? No - copy requested     Chief Complaint  Patient presents with   Acute Visit    Patient is being seen for headaches for the last two weeks from elevated BP    HPI:  Pt is a 77 y.o. female seen today for high blood pressure. Brought in her B/p machine average B/p readings runs in the 150's/90.Has had headache at the back of the head and temporal side.she denies any dizziness,vision changes,fatigue,chest tightness,palpitation,chest pain or shortness of breath.   Has had increased stress since her neighbor died recently and had to help with funeral arrangement.    Past Medical History:  Diagnosis Date   High blood pressure    Hyperlipidemia    Per previous Cardiology Record    Impaired fasting glucose    Per previous Cardiology records    Macrocytosis 04/02/2016   Per previous Cardiology records    Osteopenia 04/02/2016   Per previous Cardiology records    Past Surgical History:  Procedure Laterality Date   APPENDECTOMY     APPENDECTOMY  1977   BREAST BIOPSY  03/07/2014   Benign   BUNIONECTOMY  2000   COLONOSCOPY  10/07/2012   Per previous Cardiology records    TONSILLECTOMY AND ADENOIDECTOMY  10/08/1955   Per previous Cardiology records      Allergies  Allergen Reactions   Latex     Allergies as of 12/04/2022       Reactions   Latex         Medication List        Accurate as of December 04, 2022  2:23 PM. If you have any questions, ask your nurse or doctor.          Calcium Citrate + D3 200-6.25 MG-MCG Tabs Generic drug: Calcium Citrate-Vitamin D Take 1 tablet by mouth daily.   CENTRUM ADULTS PO Take by mouth daily.   Flaxseed Oil 1000 MG Caps Take by mouth daily.   lisinopril 20 MG tablet Commonly known as: ZESTRIL TAKE ONE TABLET BY MOUTH DAILY   Lysine HCl 1000 MG Tabs Take by mouth daily.   Magnesium 100 MG Caps Take 1 capsule by mouth once a week.   naproxen sodium 220 MG tablet Commonly known as: ALEVE Take 220 mg by mouth as needed.   valACYclovir 1000 MG tablet Commonly known as: Valtrex Take 2 tablets (2,000 mg total) by mouth as needed (for cold sore).        Review of Systems  Constitutional:  Negative for appetite change, chills, fatigue, fever and unexpected weight change.  HENT:  Negative for congestion, ear discharge, ear pain, facial swelling, hearing loss, nosebleeds, postnasal drip, rhinorrhea, sinus pressure, sinus pain, sneezing, sore throat, tinnitus and trouble swallowing.  Status post root canula   Eyes:  Negative for pain, discharge, redness, itching and visual disturbance.  Respiratory:  Negative for cough, chest tightness, shortness of breath and wheezing.   Cardiovascular:  Negative for chest pain, palpitations and leg swelling.  Gastrointestinal:  Negative for abdominal distention, abdominal pain, blood in stool, constipation, diarrhea, nausea and vomiting.  Genitourinary:  Negative for difficulty urinating, dysuria, flank pain, frequency and urgency.  Musculoskeletal:  Negative for arthralgias, back pain, gait problem, joint swelling, myalgias, neck pain and neck stiffness.  Skin:  Negative for color change, pallor, rash and wound.  Neurological:   Negative for dizziness, syncope, speech difficulty, weakness, light-headedness, numbness and headaches.  Hematological:  Does not bruise/bleed easily.  Psychiatric/Behavioral:  Negative for agitation, behavioral problems, confusion, hallucinations, self-injury, sleep disturbance and suicidal ideas. The patient is not nervous/anxious.     Immunization History  Administered Date(s) Administered   Hepatitis A 04/26/2004, 12/14/2004   Hepatitis B, ADULT 10/07/2005   IPV 07/15/2008   Td 07/21/1976, 02/03/2004   Tdap 07/15/2008   Tetanus 07/22/2018   Yellow Fever 02/03/2004   Pertinent  Health Maintenance Due  Topic Date Due   DEXA SCAN  Completed   INFLUENZA VACCINE  Discontinued   COLONOSCOPY (Pts 45-22yr Insurance coverage will need to be confirmed)  Discontinued      06/28/2021    8:18 AM 12/28/2021    4:51 PM 03/19/2022    9:44 AM 06/19/2022    1:35 PM 07/03/2022    9:04 AM  [place] the past year? 0 0 0 0 0  Was there an injury with Fall? 0 0 0 0 0  Fall Risk Category Calculator 0 0 0 0 0  Fall Risk Category (Retired) Low Low Low Low Low  (RETIRED) Patient Fall Risk Level Low fall risk Low fall risk Low fall risk Low fall risk Low fall risk  Patient at Risk for Falls Due to History of fall(s) No Fall Risks No Fall Risks No Fall Risks No Fall Risks  Fall risk Follow up Falls evaluation completed;Education provided;Falls prevention discussed Falls evaluation completed Falls evaluation completed Falls evaluation completed Falls evaluation completed   Functional Status Survey:    Vitals:   12/04/22 1356  BP: (!) 152/90  Pulse: 78  Resp: 16  Temp: (!) 97.3 F (36.3 C)  SpO2: 99%  Weight: 152 lb (68.9 kg)  Height: '5\' 3"'$  (1.6 m)   Body mass index is 26.93 kg/m. Physical Exam Vitals reviewed.  Constitutional:      General: She is not in acute distress.    Appearance: Normal appearance. She is overweight. She is not ill-appearing or diaphoretic.  HENT:      Head: Normocephalic.     Right Ear: Tympanic membrane, ear canal and external ear normal. There is no impacted cerumen.     Left Ear: Tympanic membrane, ear canal and external ear normal. There is no impacted cerumen.     Nose: Nose normal. No congestion or rhinorrhea.     Mouth/Throat:     Mouth: Mucous membranes are moist.     Pharynx: Oropharynx is clear. No oropharyngeal exudate or posterior oropharyngeal erythema.  Eyes:     General: No scleral icterus.       Right eye: No discharge.        Left eye: No discharge.     Conjunctiva/sclera: Conjunctivae normal.     Pupils: Pupils are equal, round, and reactive to light.  Neck:  Vascular: No carotid bruit.  Cardiovascular:     Rate and Rhythm: Normal rate and regular rhythm.     Pulses: Normal pulses.     Heart sounds: Normal heart sounds. No murmur heard.    No friction rub. No gallop.  Pulmonary:     Effort: Pulmonary effort is normal. No respiratory distress.     Breath sounds: Normal breath sounds. No wheezing, rhonchi or rales.  Chest:     Chest wall: No tenderness.  Abdominal:     General: Bowel sounds are normal. There is no distension.     Palpations: Abdomen is soft. There is no mass.     Tenderness: There is no abdominal tenderness. There is no right CVA tenderness, left CVA tenderness, guarding or rebound.  Musculoskeletal:        General: No swelling or tenderness. Normal range of motion.     Cervical back: Normal range of motion. No rigidity or tenderness.     Right lower leg: No edema.     Left lower leg: No edema.  Lymphadenopathy:     Cervical: No cervical adenopathy.  Skin:    General: Skin is warm and dry.     Coloration: Skin is not pale.     Findings: No bruising, erythema, lesion or rash.  Neurological:     Mental Status: She is alert and oriented to person, place, and time.     Cranial Nerves: No cranial nerve deficit.     Sensory: No sensory deficit.     Motor: No weakness.     Coordination:  Coordination normal.     Gait: Gait normal.  Psychiatric:        Mood and Affect: Mood normal.        Speech: Speech normal.        Behavior: Behavior normal.        Thought Content: Thought content normal.        Judgment: Judgment normal.     Labs reviewed: Recent Labs    12/24/21 0813 06/24/22 0856  NA 138 137  K 4.7 4.5  CL 98 99  CO2 31 26  GLUCOSE 103* 92  BUN 20 25  CREATININE 0.88 0.83  CALCIUM 10.2 9.6   Recent Labs    12/24/21 0813 06/24/22 0856  AST 14 23  ALT 11 13  BILITOT 0.6 0.8  PROT 6.8 7.0   Recent Labs    12/24/21 0813 06/24/22 0856  WBC 6.7 4.4  NEUTROABS 4,194 2,319  HGB 13.9 14.4  HCT 41.1 41.5  MCV 98.6 96.1  PLT 237 188   Lab Results  Component Value Date   TSH 3.76 12/24/2021   Lab Results  Component Value Date   HGBA1C 5.3 12/24/2021   Lab Results  Component Value Date   CHOL 228 (H) 06/24/2022   HDL 82 06/24/2022   LDLCALC 122 (H) 06/24/2022   TRIG 125 06/24/2022   CHOLHDL 2.8 06/24/2022    Significant Diagnostic Results in last 30 days:  No results found.  Assessment/Plan  Essential hypertension B/p high today  -Continue on lisinopril -Add hydrochlorothiazide 12.5 mg tablet daily -Advised to check Blood pressure at home and record on log provided and notify provider if B/p > 140/90  - lisinopril (ZESTRIL) 20 MG tablet; Take 1 tablet (20 mg total) by mouth daily.  Dispense: 90 tablet; Refill: 1 - hydrochlorothiazide (HYDRODIURIL) 12.5 MG tablet; Take 1 tablet (12.5 mg total) by mouth daily.  Dispense: 90 tablet; Refill:  3 - Has upcoming appointment 12/30/2022 will recheck B/p then adjust medication if B/p still not at goal  Family/ staff Communication: Reviewed plan of care with patient verbalized understanding  Labs/tests ordered: None   Next Appointment : Return if symptoms worsen or fail to improve.   Sandrea Hughs, NP

## 2022-12-30 ENCOUNTER — Other Ambulatory Visit: Payer: Medicare Other

## 2022-12-30 DIAGNOSIS — E782 Mixed hyperlipidemia: Secondary | ICD-10-CM

## 2022-12-30 DIAGNOSIS — I10 Essential (primary) hypertension: Secondary | ICD-10-CM | POA: Diagnosis not present

## 2022-12-31 LAB — CBC WITH DIFFERENTIAL/PLATELET
Absolute Monocytes: 516 cells/uL (ref 200–950)
Basophils Absolute: 30 cells/uL (ref 0–200)
Basophils Relative: 0.7 %
Eosinophils Absolute: 108 cells/uL (ref 15–500)
Eosinophils Relative: 2.5 %
HCT: 42.4 % (ref 35.0–45.0)
Hemoglobin: 14.2 g/dL (ref 11.7–15.5)
Lymphs Abs: 1329 cells/uL (ref 850–3900)
MCH: 32.8 pg (ref 27.0–33.0)
MCHC: 33.5 g/dL (ref 32.0–36.0)
MCV: 97.9 fL (ref 80.0–100.0)
MPV: 10.6 fL (ref 7.5–12.5)
Monocytes Relative: 12 %
Neutro Abs: 2318 cells/uL (ref 1500–7800)
Neutrophils Relative %: 53.9 %
Platelets: 191 10*3/uL (ref 140–400)
RBC: 4.33 10*6/uL (ref 3.80–5.10)
RDW: 12 % (ref 11.0–15.0)
Total Lymphocyte: 30.9 %
WBC: 4.3 10*3/uL (ref 3.8–10.8)

## 2022-12-31 LAB — COMPLETE METABOLIC PANEL WITH GFR
AG Ratio: 1.9 (calc) (ref 1.0–2.5)
ALT: 18 U/L (ref 6–29)
AST: 27 U/L (ref 10–35)
Albumin: 4.4 g/dL (ref 3.6–5.1)
Alkaline phosphatase (APISO): 60 U/L (ref 37–153)
BUN: 20 mg/dL (ref 7–25)
CO2: 28 mmol/L (ref 20–32)
Calcium: 9.6 mg/dL (ref 8.6–10.4)
Chloride: 97 mmol/L — ABNORMAL LOW (ref 98–110)
Creat: 0.74 mg/dL (ref 0.60–1.00)
Globulin: 2.3 g/dL (calc) (ref 1.9–3.7)
Glucose, Bld: 95 mg/dL (ref 65–99)
Potassium: 4.3 mmol/L (ref 3.5–5.3)
Sodium: 136 mmol/L (ref 135–146)
Total Bilirubin: 0.7 mg/dL (ref 0.2–1.2)
Total Protein: 6.7 g/dL (ref 6.1–8.1)
eGFR: 84 mL/min/{1.73_m2} (ref >=60)

## 2022-12-31 LAB — LIPID PANEL
Cholesterol: 222 mg/dL — ABNORMAL HIGH (ref <200)
HDL: 99 mg/dL (ref >=50)
LDL Cholesterol (Calc): 104 mg/dL (calc) — ABNORMAL HIGH
Non-HDL Cholesterol (Calc): 123 mg/dL (calc) (ref <130)
Total CHOL/HDL Ratio: 2.2 (calc) (ref <5.0)
Triglycerides: 96 mg/dL (ref <150)

## 2022-12-31 LAB — TSH: TSH: 3.9 mIU/L (ref 0.40–4.50)

## 2023-01-01 ENCOUNTER — Ambulatory Visit (INDEPENDENT_AMBULATORY_CARE_PROVIDER_SITE_OTHER): Payer: Medicare Other | Admitting: Family

## 2023-01-01 ENCOUNTER — Encounter: Payer: Self-pay | Admitting: Family

## 2023-01-01 VITALS — BP 114/76 | HR 78 | Temp 97.4°F | Resp 16 | Ht 63.0 in | Wt 149.4 lb

## 2023-01-01 DIAGNOSIS — R079 Chest pain, unspecified: Secondary | ICD-10-CM | POA: Diagnosis not present

## 2023-01-01 DIAGNOSIS — I10 Essential (primary) hypertension: Secondary | ICD-10-CM | POA: Diagnosis not present

## 2023-01-01 NOTE — Progress Notes (Signed)
Provider: Richarda Bladeinah Aviona Martenson FNP-C  Jackob Crookston, Donalee Citrininah C, NP  Patient Care Team: Domonique Brouillard, Donalee Citrininah C, NP as PCP - General (Family Medicine) Glendale ChardPatel, Donika K, DO as Consulting Physician (Neurology)  Extended Emergency Contact Information Primary Emergency Contact: Ritchie,Rob Mobile Phone: [phone] Relation: Son  Code Status:  Full Code  Goals of care: Advanced Directive information    07/03/2022    9:04 AM  Advanced Directives  Does Patient Have a Medical Advance Directive? Yes  Type of Estate agentAdvance Directive Healthcare Power of [place];Living will;Out of facility DNR (pink MOST or yellow form)  Does patient want to make changes to medical advance directive? No - Patient declined  Copy of Healthcare Power of Attorney in Chart? No - copy requested     Chief Complaint  Patient presents with   Medical Management of Chronic Issues    FU HTN. Started HCTZ and her Bp is calming down. She had a wave of dull pain that spread through shoulder blades and then her arm shaked but then it stopped no after ffects, unsure if due to med    HPI:  Pt is a 77 y.o. female seen today for an acute visit for follow up high blood pressure.she was here 12/04/2022 her B/p was 152/90 HCZT 12.5 mg tablet daily was added.states B/p at home has been in the 140's/80's prior to taking medications and 110's - 120's/80's after taking the medication during the day.  States previous heaviness/headache has resolved. She had a wave of dull pain that spread through her shoulder blades and both arm shocked but then it stopped after few seconds 4 days ago.Had no other associated factors.   She denies any fever,chills,cough,fatigue,body aches,runny nose,chest tightness,chest pain,palpitation or shortness of breath.  Past Medical History:  Diagnosis Date   High blood pressure    Hyperlipidemia    Per previous Cardiology Record    Impaired fasting glucose    Per previous Cardiology records    Macrocytosis 04/02/2016   Per  previous Cardiology records    Osteopenia 04/02/2016   Per previous Cardiology records    Past Surgical History:  Procedure Laterality Date   APPENDECTOMY     APPENDECTOMY  1977   BREAST BIOPSY  03/07/2014   Benign   BUNIONECTOMY  2000   COLONOSCOPY  10/07/2012   Per previous Cardiology records    TONSILLECTOMY AND ADENOIDECTOMY  10/08/1955   Per previous Cardiology records     Allergies  Allergen Reactions   Latex     Outpatient Encounter Medications as of 01/01/2023  Medication Sig   aspirin EC 325 MG tablet Take 325 mg by mouth daily.   Calcium Citrate-Vitamin D (CALCIUM CITRATE + D3) 200-250 MG-UNIT TABS Take 1 tablet by mouth daily.   Flaxseed, Linseed, (FLAXSEED OIL) 1000 MG CAPS Take by mouth daily.   hydrochlorothiazide (HYDRODIURIL) 12.5 MG tablet Take 1 tablet (12.5 mg total) by mouth daily.   lisinopril (ZESTRIL) 20 MG tablet Take 1 tablet (20 mg total) by mouth daily.   Lysine HCl 1000 MG TABS Take by mouth daily.   Magnesium 100 MG CAPS Take 1 capsule by mouth once a week.   Multiple Vitamins-Minerals (CENTRUM ADULTS PO) Take by mouth daily.   naproxen sodium (ALEVE) 220 MG tablet Take 220 mg by mouth as needed.   POTASSIUM PO Take by mouth.   valACYclovir (VALTREX) 1000 MG tablet Take 2 tablets (2,000 mg total) by mouth as needed (for cold sore).   No facility-administered encounter medications on file  as of 01/01/2023.    Review of Systems  Constitutional:  Negative for appetite change, chills, fatigue, fever and unexpected weight change.  Eyes:  Positive for visual disturbance. Negative for pain, discharge, redness and itching.       Wears eye glasses   Respiratory:  Negative for cough, chest tightness, shortness of breath and wheezing.   Cardiovascular:  Negative for chest pain, palpitations and leg swelling.  Gastrointestinal:  Negative for abdominal distention, abdominal pain, constipation, diarrhea, nausea and vomiting.  Musculoskeletal:  Negative for  arthralgias, back pain, gait problem, joint swelling, myalgias, neck pain and neck stiffness.  Skin:  Negative for color change, pallor and rash.  Neurological:  Negative for dizziness, syncope, speech difficulty, weakness, light-headedness, numbness and headaches.  Psychiatric/Behavioral:  Negative for agitation, behavioral problems and confusion. The patient is not nervous/anxious.     Immunization History  Administered Date(s) Administered   Hepatitis A 04/26/2004, 12/14/2004   Hepatitis B, ADULT 10/07/2005   IPV 07/15/2008   Td 07/21/1976, 02/03/2004   Tdap 07/15/2008   Tetanus 07/22/2018   Yellow Fever 02/03/2004   Pertinent  Health Maintenance Due  Topic Date Due   DEXA SCAN  Completed   INFLUENZA VACCINE  Discontinued   COLONOSCOPY (Pts 45-36yrs Insurance coverage will need to be confirmed)  Discontinued      12/28/2021    4:51 PM 03/19/2022    9:44 AM 06/19/2022    1:35 PM 07/03/2022    9:04 AM 01/01/2023    9:04 AM  Fall Risk  Falls in the past year? 0 0 0 0 0  Was there an injury with Fall? 0 0 0 0 0  Fall Risk Category Calculator 0 0 0 0 0  Fall Risk Category (Retired) Low Low Low Low   (RETIRED) Patient Fall Risk Level Low fall risk Low fall risk Low fall risk Low fall risk   Patient at Risk for Falls Due to No Fall Risks No Fall Risks No Fall Risks No Fall Risks No Fall Risks  Fall risk Follow up Falls evaluation completed Falls evaluation completed Falls evaluation completed Falls evaluation completed Falls evaluation completed;Education provided;Falls prevention discussed   Functional Status Survey:    Vitals:   01/01/23 0905  BP: 114/76  Pulse: 78  Resp: 16  Temp: (!) 97.4 F (36.3 C)  TempSrc: Temporal  SpO2: 98%  Weight: 149 lb 6.4 oz (67.8 kg)  Height: 5\' 3"  (1.6 m)   Body mass index is 26.47 kg/m. Physical Exam Vitals reviewed.  Constitutional:      General: She is not in acute distress.    Appearance: Normal appearance. She is overweight. She  is not ill-appearing or diaphoretic.  HENT:     Head: Normocephalic.     Nose: Nose normal. No congestion or rhinorrhea.     Mouth/Throat:     Mouth: Mucous membranes are moist.     Pharynx: Oropharynx is clear. No oropharyngeal exudate or posterior oropharyngeal erythema.  Eyes:     General: No scleral icterus.       Right eye: No discharge.        Left eye: No discharge.     Extraocular Movements: Extraocular movements intact.     Conjunctiva/sclera: Conjunctivae normal.     Pupils: Pupils are equal, round, and reactive to light.  Neck:     Vascular: No carotid bruit.  Cardiovascular:     Rate and Rhythm: Normal rate and regular rhythm.     Pulses: Normal  pulses.     Heart sounds: Normal heart sounds. No murmur heard.    No friction rub. No gallop.  Pulmonary:     Effort: Pulmonary effort is normal. No respiratory distress.     Breath sounds: Normal breath sounds. No wheezing, rhonchi or rales.  Chest:     Chest wall: No tenderness.  Abdominal:     General: Bowel sounds are normal. There is no distension.     Palpations: Abdomen is soft. There is no mass.     Tenderness: There is no abdominal tenderness. There is no right CVA tenderness, left CVA tenderness, guarding or rebound.  Musculoskeletal:        General: No swelling or tenderness. Normal range of motion.     Cervical back: Normal range of motion. No rigidity or tenderness.     Right lower leg: No edema.     Left lower leg: No edema.  Lymphadenopathy:     Cervical: No cervical adenopathy.  Skin:    General: Skin is warm and dry.     Coloration: Skin is not pale.     Findings: No bruising, erythema, lesion or rash.  Neurological:     Mental Status: She is alert and oriented to person, place, and time.     Cranial Nerves: No cranial nerve deficit.     Sensory: No sensory deficit.     Motor: No weakness.     Coordination: Coordination normal.     Gait: Gait normal.  Psychiatric:        Mood and Affect: Mood  normal.        Speech: Speech normal.        Behavior: Behavior normal.    Labs reviewed: Recent Labs    06/24/22 0856 12/30/22 0803  NA 137 136  K 4.5 4.3  CL 99 97*  CO2 26 28  GLUCOSE 92 95  BUN 25 20  CREATININE 0.83 0.74  CALCIUM 9.6 9.6   Recent Labs    06/24/22 0856 12/30/22 0803  AST 23 27  ALT 13 18  BILITOT 0.8 0.7  PROT 7.0 6.7   Recent Labs    06/24/22 0856 12/30/22 0803  WBC 4.4 4.3  NEUTROABS 2,319 2,318  HGB 14.4 14.2  HCT 41.5 42.4  MCV 96.1 97.9  PLT 188 191   Lab Results  Component Value Date   TSH 3.90 12/30/2022   Lab Results  Component Value Date   HGBA1C 5.3 12/24/2021   Lab Results  Component Value Date   CHOL 222 (H) 12/30/2022   HDL 99 12/30/2022   LDLCALC 104 (H) 12/30/2022   TRIG 96 12/30/2022   CHOLHDL 2.2 12/30/2022    Significant Diagnostic Results in last 30 days:  No results found.  Assessment/Plan  1. Chest pain, unspecified type Had a wave of dull pain that spread through her shoulder blades and both arm shocked but then it stopped after few seconds 4 days ago. - EKG indicates sinus rhythm with left artrial enlargement HR 88 b/min low voltage in precordial leads previous EKG indicated sinus Rhythm.  - continue to monitor  -Advised to notify provider or go to the ED if symptoms recurs -Continue on aspirin  2. Essential hypertension -Blood pressure well-controlled -Dietary modification and exercise advised -Continue on lisinopril and hydrochlorothiazide -Will check lab work and then consider starting on a statin - EKG 12-Lead as above  - Lipid panel; Future - TSH; Future - COMPLETE METABOLIC PANEL WITH GFR; Future -  CBC with Differential/Platelet; Future  Family/ staff Communication: Reviewed plan of care with patient verbalized understanding  Labs/tests ordered:  - EKG 12-Lead as above  - Lipid panel; Future - TSH; Future - COMPLETE METABOLIC PANEL WITH GFR; Future - CBC with Differential/Platelet;  Future  Next Appointment: Return in about 6 months (around 07/04/2023) for medical mangement of chronic issues.Caesar Bookman, NP

## 2023-03-05 ENCOUNTER — Other Ambulatory Visit: Payer: Self-pay

## 2023-03-05 ENCOUNTER — Emergency Department (HOSPITAL_COMMUNITY)
Admission: EM | Admit: 2023-03-05 | Discharge: 2023-03-05 | Disposition: A | Discharge status: Home / Self Care | Payer: Medicare Other | Attending: Emergency Medicine | Admitting: Emergency Medicine

## 2023-03-05 ENCOUNTER — Encounter (HOSPITAL_COMMUNITY): Payer: Self-pay

## 2023-03-05 DIAGNOSIS — R0789 Other chest pain: Secondary | ICD-10-CM | POA: Diagnosis not present

## 2023-03-05 DIAGNOSIS — Z7982 Long term (current) use of aspirin: Secondary | ICD-10-CM | POA: Diagnosis not present

## 2023-03-05 DIAGNOSIS — Z91040 Latex allergy status: Secondary | ICD-10-CM | POA: Insufficient documentation

## 2023-03-05 LAB — CBC WITH DIFFERENTIAL/PLATELET
Abs Immature Granulocytes: 0.01 10*3/uL (ref 0.00–0.07)
Basophils Absolute: 0 10*3/uL (ref 0.0–0.1)
Basophils Relative: 1 %
Eosinophils Absolute: 0.1 10*3/uL (ref 0.0–0.5)
Eosinophils Relative: 1 %
HCT: 40.4 % (ref 36.0–46.0)
Hemoglobin: 13.8 g/dL (ref 12.0–15.0)
Immature Granulocytes: 0 %
Lymphocytes Relative: 31 %
Lymphs Abs: 1.6 10*3/uL (ref 0.7–4.0)
MCH: 33.3 pg (ref 26.0–34.0)
MCHC: 34.2 g/dL (ref 30.0–36.0)
MCV: 97.6 fL (ref 80.0–100.0)
Monocytes Absolute: 0.7 10*3/uL (ref 0.1–1.0)
Monocytes Relative: 14 %
Neutro Abs: 2.9 10*3/uL (ref 1.7–7.7)
Neutrophils Relative %: 53 %
Platelets: 173 10*3/uL (ref 150–400)
RBC: 4.14 MIL/uL (ref 3.87–5.11)
RDW: 13 % (ref 11.5–15.5)
WBC: 5.4 10*3/uL (ref 4.0–10.5)
nRBC: 0 % (ref 0.0–0.2)

## 2023-03-05 LAB — BASIC METABOLIC PANEL
Anion gap: 13 (ref 5–15)
BUN: 19 mg/dL (ref 8–23)
CO2: 25 mmol/L (ref 22–32)
Calcium: 10.1 mg/dL (ref 8.9–10.3)
Chloride: 95 mmol/L — ABNORMAL LOW (ref 98–111)
Creatinine, Ser: 0.75 mg/dL (ref 0.44–1.00)
GFR, Estimated: >60 mL/min (ref >60)
Glucose, Bld: 92 mg/dL (ref 70–99)
Potassium: 4.1 mmol/L (ref 3.5–5.1)
Sodium: 133 mmol/L — ABNORMAL LOW (ref 135–145)

## 2023-03-05 LAB — TROPONIN I (HIGH SENSITIVITY): Troponin I (High Sensitivity): 3 ng/L (ref <18)

## 2023-03-05 NOTE — ED Provider Notes (Signed)
[facility] AT [facility] Provider Note   CSN: [id number] Arrival date & time: 03/05/23  1233     History  Chief Complaint  Patient presents with   Chest Pain    Laurie Decker is a 77 y.o. female.  77 year old female with prior medical history as detailed below presents for evaluation.  Patient complains of substernal chest discomfort.  This has been constantly present for the last 24 hours.  Patient denies associated nausea, vomiting, other complaint.  Patient denies known cardiac pathology.  Of note, patient is retired Engineer, civil (consulting).  The history is provided by the patient and medical records.       Home Medications Prior to Admission medications   Medication Sig Start Date End Date Taking? Authorizing Provider  aspirin EC 325 MG tablet Take 325 mg by mouth daily.    [provider]  Calcium Citrate-Vitamin D (CALCIUM CITRATE + D3) 200-250 MG-UNIT TABS Take 1 tablet by mouth daily. 06/28/21 01/01/23  Ngetich, Dinah C, NP  Flaxseed, Linseed, (FLAXSEED OIL) 1000 MG CAPS Take by mouth daily.    [provider]  hydrochlorothiazide (HYDRODIURIL) 12.5 MG tablet Take 1 tablet (12.5 mg total) by mouth daily. 12/04/22   Ngetich, Dinah C, NP  lisinopril (ZESTRIL) 20 MG tablet Take 1 tablet (20 mg total) by mouth daily. 12/04/22   Ngetich, Dinah C, NP  Lysine HCl 1000 MG TABS Take by mouth daily.    [provider]  Magnesium 100 MG CAPS Take 1 capsule by mouth once a week.    [provider]  Multiple Vitamins-Minerals (CENTRUM ADULTS PO) Take by mouth daily.    [provider]  naproxen sodium (ALEVE) 220 MG tablet Take 220 mg by mouth as needed.    [provider]  POTASSIUM PO Take by mouth.    [provider]  valACYclovir (VALTREX) 1000 MG tablet Take 2 tablets (2,000 mg total) by mouth as needed (for cold sore). 12/31/21   Ngetich, Donalee Citrin, NP      Allergies    Latex    Review of  Systems   Review of Systems  All other systems reviewed and are negative.   Physical Exam Updated Vital Signs BP (!) 147/58 (BP Location: Left Arm)   Pulse 78   Temp 98.3 F (36.8 C) (Oral)   Resp 17   Ht 5\' 3"  (1.6 m)   Wt 67.1 kg   SpO2 99%   BMI 26.22 kg/m  Physical Exam Vitals and nursing note reviewed.  Constitutional:      General: She is not in acute distress.    Appearance: Normal appearance. She is well-developed.  HENT:     Head: Normocephalic and atraumatic.  Eyes:     Conjunctiva/sclera: Conjunctivae normal.     Pupils: Pupils are equal, round, and reactive to light.  Cardiovascular:     Rate and Rhythm: Normal rate and regular rhythm.     Heart sounds: Normal heart sounds.  Pulmonary:     Effort: Pulmonary effort is normal. No respiratory distress.     Breath sounds: Normal breath sounds.  Abdominal:     General: There is no distension.     Palpations: Abdomen is soft.     Tenderness: There is no abdominal tenderness.  Musculoskeletal:        General: No deformity. Normal range of motion.     Cervical back: Normal range of motion and neck supple.  Skin:    General:  Skin is warm and dry.  Neurological:     General: No focal deficit present.     Mental Status: She is alert and oriented to person, place, and time.     ED Results / Procedures / Treatments   Labs (all labs ordered are listed, but only abnormal results are displayed) Labs Reviewed  BASIC METABOLIC PANEL - Abnormal; Notable for the following components:      Result Value   Sodium 133 (*)    Chloride 95 (*)    All other components within normal limits  CBC WITH DIFFERENTIAL/PLATELET  TROPONIN I (HIGH SENSITIVITY)    EKG EKG Interpretation  Date/Time:  Wednesday Mar 05 2023 12:43:23 EDT Ventricular Rate:  83 PR Interval:  144 QRS Duration: 85 QT Interval:  357 QTC Calculation: 420 R Axis:   -16 Text Interpretation: Sinus rhythm Borderline left axis deviation Consider anterior  infarct Confirmed by Kristine Royal [phone]) on 03/05/2023 1:00:16 PM  Radiology No results found.  Procedures Procedures    Medications Ordered in ED Medications - No data to display  ED Course/ Medical Decision Making/ A&P                             Medical Decision Making Amount and/or Complexity of Data Reviewed Labs: ordered.    Medical Screen Complete  This patient presented to the ED with complaint of atypical chest pain.  This complaint involves an extensive number of treatment options. The initial differential diagnosis includes, but is not limited to, ACS, metabolic abnormality, etc.  This presentation is: Acute, Self-Limited, Previously Undiagnosed, Uncertain Prognosis, Complicated, Systemic Symptoms, and Threat to Life/Bodily Function  Patient is presenting with complaint of substernal chest discomfort.  Describes symptoms are not entirely consistent with ACS.  Initial EKG was without evidence of acute ischemia.  Initial troponin is barely detectable at 3.  Patient is significantly reassured by ED workup and findings.  She declines waiting for second troponin check.  Patient is a retired Charity fundraiser.  She is well aware of the risk of undiagnosed pathology.  She is interested in outpatient follow-up with cardiology.  She understands referral process for cardiology evaluation in the outpatient setting.  Importance of close follow-up is stressed.  Strict return precautions given and understood.     Additional history obtained:  External records from outside sources obtained and reviewed including prior ED visits and prior Inpatient records.    Lab Tests:  I ordered and personally interpreted labs.    Cardiac Monitoring:  The patient was maintained on a cardiac monitor.  I personally viewed and interpreted the cardiac monitor which showed an underlying rhythm of: NSR  Problem List / ED Course:  Chest pain    Reevaluation:  After the interventions noted above,  I reevaluated the patient and found that they have: improved  Disposition:  After consideration of the diagnostic results and the patients response to treatment, I feel that the patent would benefit from close outpatient followup.          Final Clinical Impression(s) / ED Diagnoses Final diagnoses:  Atypical chest pain    Rx / DC Orders ED Discharge Orders          Ordered    Ambulatory referral to Cardiology       Comments: If you have not heard from the Cardiology office within the next 72 hours please call [phone].   03/05/23 1417  Wynetta Fines, MD 03/05/23 1426

## 2023-03-05 NOTE — ED Triage Notes (Signed)
C/o centralized cp radiating to left side of back that started yesterday when walking accompanied with sob.  Described as dull ache/pressure.

## 2023-03-05 NOTE — Discharge Instructions (Addendum)
Return for any problem.  ?

## 2023-04-07 ENCOUNTER — Other Ambulatory Visit: Payer: Self-pay | Admitting: Family

## 2023-04-07 DIAGNOSIS — Z1231 Encounter for screening mammogram for malignant neoplasm of breast: Secondary | ICD-10-CM

## 2023-04-27 ENCOUNTER — Encounter: Payer: Self-pay | Admitting: Cardiology

## 2023-04-27 DIAGNOSIS — R072 Precordial pain: Secondary | ICD-10-CM | POA: Insufficient documentation

## 2023-04-27 NOTE — Progress Notes (Unsigned)
Primary Care Provider: Caesar Bookman, NP-[facility] Neurology: Glendale Chard, DO (Consulting Physician)  [facility] Cardiologist: None Previously followed by Dr. Rinaldo Cloud  Electrophysiologist: None  Clinic Note: No chief complaint on file.   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  Laurie Decker was last seen by Caesar Bookman, NP on January 01, 2023 as a BP follow-up.  Had been seen 1 month prior and started on HCTZ for blood pressure 150/90.  Noted significant improvement in blood pressures.  Headaches resolved.  Noted a dull pain that spread from her shoulder blades and both arms lasted a few seconds.  Otherwise no symptoms.  Recent Hospitalizations:  [facility], ER 03/05/2023: Presented with substernal chest discomfort.  Mostly constant, lasting 24+ hours. EKG relatively benign.  Troponin negative.  Did not want to wait for second troponin.  We agreed to referral to cardiology.  Reviewed  CV studies:    The following studies were reviewed today: (if available, images/films reviewed: From Epic Chart or Care Everywhere) ***:   Interval History:   Laurie Decker presents for ER follow-up of episode of chest pain.   CV Review of Symptoms (Summary): Cardiovascular ROS: {[id number]}  REVIEWED OF SYSTEMS   ROS -> Wears glasses  I have reviewed and (if needed) personally updated the patient's problem list, medications, allergies, past  medical and surgical history, social and family history.   PAST MEDICAL HISTORY   Past Medical History:  Diagnosis Date   High blood pressure    Hyperlipidemia    Per previous Cardiology Record    Impaired fasting glucose    Per previous Cardiology records    Macrocytosis 04/02/2016   Per previous Cardiology records    Osteopenia 04/02/2016   Per previous Cardiology records     PAST SURGICAL HISTORY   Past Surgical History:  Procedure Laterality Date   APPENDECTOMY     APPENDECTOMY  1977   BREAST BIOPSY  03/07/2014   Benign   BUNIONECTOMY  2000   COLONOSCOPY  10/07/2012   Per previous Cardiology records    TONSILLECTOMY AND ADENOIDECTOMY  10/08/1955   Per previous Cardiology records     Immunization History  Administered Date(s) Administered   Hepatitis A 04/26/2004, 12/14/2004   Hepatitis B, ADULT 10/07/2005   IPV 07/15/2008   Td 07/21/1976, 02/03/2004   Tdap 07/15/2008   Tetanus 07/22/2018   Yellow Fever 02/03/2004    MEDICATIONS/ALLERGIES   No outpatient medications have been marked as taking for the 04/29/23 encounter (Appointment) with Marykay Lex, MD.    Allergies  Allergen Reactions   Latex     SOCIAL HISTORY/FAMILY HISTORY   Reviewed in Epic:   Social History   Tobacco Use   Smoking status: Former    Current packs/day: 3.00    Types: Cigarettes   Smokeless tobacco: Never   Tobacco comments:    Quit in 20s  Vaping Use   Vaping status: Never Used  Substance  Use Topics   Alcohol use: Yes    Alcohol/week: 2.0 standard drinks of alcohol    Types: 2 Shots of liquor per week    Comment: 2 vodka shots  weekly   Drug use: No   Social History   Social History Narrative   Married, if yes what year: 2014   Do you live in a house, apartment, assisted living, condo, trailer, ect: House, 1 stories, 2 persons; Pets: 1 dog    Diet: N/A   Caffeine: 1 cup of coffee daily      Current/Past profession: Nursing School       Exercise: Yes,  daily   rigth handed      Living Will:  Yes; DNR: Yes      POA/HPOA: Yes .   Extended Emergency Contact Information   Primary Emergency Contact: Ritchie,Rob; Relation: Son   Mobile Phone: [phone]      Functional Status:   Do you have difficulty bathing or dressing yourself? No   Do you have difficulty preparing food or eating? No   Do you have difficulty managing your medications? No   Do you have difficulty managing your finances? No   Do you have difficulty affording your medications? No   Family History  Problem Relation Age of Onset   Heart disease Mother    Alcoholism Father    Colon cancer Father        malignant tumor of colon     OBJCTIVE -PE, EKG, labs   Wt Readings from Last 3 Encounters:  03/05/23 148 lb (67.1 kg)  01/01/23 149 lb 6.4 oz (67.8 kg)  12/04/22 152 lb (68.9 kg)    Physical Exam: There were no vitals taken for this visit. Physical Exam Vitals reviewed.  Constitutional:      General: She is not in acute distress.    Appearance: Normal appearance. She is normal weight. She is not ill-appearing or toxic-appearing.     Comments: Wears glasses  HENT:     Head: Normocephalic and atraumatic.  Neck:     Vascular: No carotid bruit.  Cardiovascular:     Rate and Rhythm: Normal rate and regular rhythm.     Pulses: Normal pulses.     Heart sounds: Normal heart sounds. No murmur heard.    No friction rub. No gallop.  Pulmonary:     Effort: Pulmonary effort is normal. No respiratory distress.     Breath sounds: Normal breath sounds. No wheezing, rhonchi or rales.  Chest:     Chest wall: No tenderness.  Abdominal:     General: Abdomen is flat. Bowel sounds are normal. There is no distension.     Palpations: Abdomen is soft. There is no mass.     Tenderness: There is no abdominal tenderness. There is no guarding or rebound.     Hernia: No hernia is present.     Comments: No HSM or bruit  Musculoskeletal:        General: No swelling. Normal  range of motion.     Cervical back: Normal range of motion and neck supple.  Skin:    General: Skin is warm and dry.  Neurological:     General: No focal deficit present.     Mental Status: She is alert and oriented to person, place, and time.     Gait: Gait normal.  Psychiatric:        Mood and Affect: Mood normal.        Behavior: Behavior  normal.        Thought Content: Thought content normal.        Judgment: Judgment normal.     Adult ECG Report     Recent Labs:  reviewed  Lab Results  Component Value Date   CHOL 222 (H) 12/30/2022   HDL 99 12/30/2022   LDLCALC 104 (H) 12/30/2022   TRIG 96 12/30/2022   CHOLHDL 2.2 12/30/2022   Lab Results  Component Value Date   NA 133 (L) 03/05/2023   CL 95 (L) 03/05/2023   K 4.1 03/05/2023   CO2 25 03/05/2023   BUN 19 03/05/2023   CREATININE 0.75 03/05/2023   GFRNONAA >60 03/05/2023   CALCIUM 10.1 03/05/2023   GLUCOSE 92 03/05/2023      Latest Ref Rng & Units 03/05/2023    1:00 PM 12/30/2022    8:03 AM 06/24/2022    8:56 AM  CBC  WBC 4.0 - 10.5 K/uL 5.4  4.3  4.4   Hemoglobin 12.0 - 15.0 g/dL 16.1  09.6  04.5   Hematocrit 36.0 - 46.0 % 40.4  42.4  41.5   Platelets 150 - 400 K/uL 173  191  188     Lab Results  Component Value Date   HGBA1C 5.3 12/24/2021   Lab Results  Component Value Date   TSH 3.90 12/30/2022    ================================================== Current medicines are reviewed at length with the patient today.  (+/- concerns) ***  Notice: This dictation was prepared with Dragon dictation along with smart phrase technology. Any transcriptional errors that result from this process are unintentional and may not be corrected upon review.   Studies Ordered:  No orders of the defined types were placed in this encounter.  No orders of the defined types were placed in this encounter.   Patient Instructions / Medication Changes & Studies & Tests Ordered   There are no Patient Instructions on file  for this visit.    {Are you ordering a CV Procedure (e.g. stress test, cath, DCCV, TEE, etc)?   Press F2        :[id number]}   Dispo: No follow-ups on file.  Total time spent: *** min spent with patient + *** min spent charting = *** min  Signed,  Marykay Lex, MD, MS Bryan Lemma, M.D., M.S. Interventional Cardiologist  [facility]  Pager # [phone] Phone # [phone] [address] [place], [place] [address]   Thank you for choosing [facility]!!

## 2023-04-29 ENCOUNTER — Ambulatory Visit: Payer: Medicare Other | Attending: Cardiology | Admitting: Cardiology

## 2023-04-29 ENCOUNTER — Encounter: Payer: Self-pay | Admitting: Cardiology

## 2023-04-29 VITALS — BP 138/76 | HR 78 | Ht 63.0 in | Wt 155.8 lb

## 2023-04-29 DIAGNOSIS — I10 Essential (primary) hypertension: Secondary | ICD-10-CM | POA: Diagnosis not present

## 2023-04-29 DIAGNOSIS — R072 Precordial pain: Secondary | ICD-10-CM | POA: Insufficient documentation

## 2023-04-29 DIAGNOSIS — E782 Mixed hyperlipidemia: Secondary | ICD-10-CM | POA: Insufficient documentation

## 2023-04-29 DIAGNOSIS — E663 Overweight: Secondary | ICD-10-CM | POA: Insufficient documentation

## 2023-04-29 NOTE — Assessment & Plan Note (Signed)
LDL was 104 which is not bad.  Thank you, A1c was only 5.3.  Can reassess in follow-up but would defer to PCP if starts getting elevated, low threshold to treat. In this situation could consider coronary calcium score Coronary CTA for risk stratification.

## 2023-04-29 NOTE — Assessment & Plan Note (Addendum)
Again 1 spell that lasted about 25 to 30 minutes.  Mostly rest but after some exertion.  Has not had any further symptoms with the rest or exertion since then.  Unlikely that this is cardiac in nature.  Plan for now is expectant management unless symptoms recur in which case I would consider ischemic evaluation.  Myxoid continue cardiac risk factor modification of ensuring adequate blood pressure control, following lipids as her LDL was 104 back in March.  Will hold off on stress test for now, simply treat risk factors.  For now, we will plan PRN follow-up.  I suggest the possibility of screening test such as Coronary Calcium Score, she will take it under advisement and consider for future follow-up visits.  At this point we will get a plan that maybe she comes back and sees me in 2 to 3 years to maintain her status is a current patient.Marland Kitchen

## 2023-04-29 NOTE — Patient Instructions (Addendum)
Medication Instructions:   No changes     Lab Work: Not needed    Testing/Procedures:  Not needed  Follow-Up: At [facility], you and your health needs are our priority.  As part of our continuing mission to provide you with exceptional heart care, we have created designated Provider Care Teams.  These Care Teams include your primary Cardiologist (physician) and Advanced Practice Providers (APPs -  Physician Assistants and Nurse Practitioners) who all work together to provide you with the care you need, when you need it.     Your next appointment:   As needed   The format for your next appointment:   In Person  Provider:   David Harding, MD     

## 2023-04-29 NOTE — Assessment & Plan Note (Signed)
BP is little high on initial check here today but she did acknowledge being a little anxious.  On recheck was better.  Has not been an issue on current dose of lisinopril.  Defer to PCP.

## 2023-04-30 ENCOUNTER — Ambulatory Visit: Admission: RE | Admit: 2023-04-30 | Payer: Medicare Other | Source: Ambulatory Visit

## 2023-04-30 DIAGNOSIS — Z1231 Encounter for screening mammogram for malignant neoplasm of breast: Secondary | ICD-10-CM | POA: Diagnosis not present

## 2023-07-01 ENCOUNTER — Other Ambulatory Visit: Payer: Medicare Other

## 2023-07-01 DIAGNOSIS — I10 Essential (primary) hypertension: Secondary | ICD-10-CM

## 2023-07-02 LAB — LIPID PANEL
Cholesterol: 229 mg/dL — ABNORMAL HIGH (ref <200)
HDL: 90 mg/dL (ref >=50)
LDL Cholesterol (Calc): 111 mg/dL (calc) — ABNORMAL HIGH
Non-HDL Cholesterol (Calc): 139 mg/dL (calc) — ABNORMAL HIGH (ref <130)
Total CHOL/HDL Ratio: 2.5 (calc) (ref <5.0)
Triglycerides: 166 mg/dL — ABNORMAL HIGH (ref <150)

## 2023-07-02 LAB — COMPLETE METABOLIC PANEL WITH GFR
AG Ratio: 1.9 (calc) (ref 1.0–2.5)
ALT: 20 U/L (ref 6–29)
AST: 27 U/L (ref 10–35)
Albumin: 4.5 g/dL (ref 3.6–5.1)
Alkaline phosphatase (APISO): 58 U/L (ref 37–153)
BUN: 16 mg/dL (ref 7–25)
CO2: 28 mmol/L (ref 20–32)
Calcium: 9.6 mg/dL (ref 8.6–10.4)
Chloride: 97 mmol/L — ABNORMAL LOW (ref 98–110)
Creat: 0.77 mg/dL (ref 0.60–1.00)
Globulin: 2.4 g/dL (calc) (ref 1.9–3.7)
Glucose, Bld: 89 mg/dL (ref 65–99)
Potassium: 4.3 mmol/L (ref 3.5–5.3)
Sodium: 136 mmol/L (ref 135–146)
Total Bilirubin: 0.7 mg/dL (ref 0.2–1.2)
Total Protein: 6.9 g/dL (ref 6.1–8.1)
eGFR: 80 mL/min/{1.73_m2} (ref >=60)

## 2023-07-02 LAB — CBC WITH DIFFERENTIAL/PLATELET
Absolute Monocytes: 459 {cells}/uL (ref 200–950)
Basophils Absolute: 31 cells/uL (ref 0–200)
Basophils Relative: 0.9 %
Eosinophils Absolute: 129 cells/uL (ref 15–500)
Eosinophils Relative: 3.8 %
HCT: 43.3 % (ref 35.0–45.0)
Hemoglobin: 14.3 g/dL (ref 11.7–15.5)
Lymphs Abs: 1081 {cells}/uL (ref 850–3900)
MCH: 34 pg — ABNORMAL HIGH (ref 27.0–33.0)
MCHC: 33 g/dL (ref 32.0–36.0)
MCV: 102.9 fL — ABNORMAL HIGH (ref 80.0–100.0)
MPV: 10.1 fL (ref 7.5–12.5)
Monocytes Relative: 13.5 %
Neutro Abs: 1700 cells/uL (ref 1500–7800)
Neutrophils Relative %: 50 %
Platelets: 189 10*3/uL (ref 140–400)
RBC: 4.21 10*6/uL (ref 3.80–5.10)
RDW: 12.4 % (ref 11.0–15.0)
Total Lymphocyte: 31.8 %
WBC: 3.4 10*3/uL — ABNORMAL LOW (ref 3.8–10.8)

## 2023-07-02 LAB — TSH: TSH: 2.86 mIU/L (ref 0.40–4.50)

## 2023-07-04 ENCOUNTER — Encounter: Payer: Medicare Other | Admitting: Family

## 2023-07-06 NOTE — Progress Notes (Signed)
  This encounter was created in error - please disregard. No show 

## 2023-07-14 ENCOUNTER — Encounter: Payer: Self-pay | Admitting: Family

## 2023-07-14 ENCOUNTER — Ambulatory Visit (INDEPENDENT_AMBULATORY_CARE_PROVIDER_SITE_OTHER): Payer: Medicare Other | Admitting: Family

## 2023-07-14 VITALS — BP 138/76 | HR 66 | Temp 97.5°F | Ht 63.0 in | Wt 154.4 lb

## 2023-07-14 DIAGNOSIS — I10 Essential (primary) hypertension: Secondary | ICD-10-CM

## 2023-07-14 DIAGNOSIS — K219 Gastro-esophageal reflux disease without esophagitis: Secondary | ICD-10-CM

## 2023-07-14 DIAGNOSIS — E782 Mixed hyperlipidemia: Secondary | ICD-10-CM | POA: Diagnosis not present

## 2023-07-14 DIAGNOSIS — Z2821 Immunization not carried out because of patient refusal: Secondary | ICD-10-CM | POA: Diagnosis not present

## 2023-07-14 NOTE — Progress Notes (Signed)
Provider: Richarda Blade FNP-C   Raiyah Speakman, Donalee Citrin, NP  Patient Care Team: Sharnese Heath, Donalee Citrin, NP as PCP - General (Family Medicine) Marykay Lex, MD as PCP - Cardiology (Cardiology) Glendale Chard, DO as Consulting Physician (Neurology)  Extended Emergency Contact Information Primary Emergency Contact: Ritchie,Rob Mobile Phone: [phone] Relation: Son  Code Status:  Full Code  Goals of care: Advanced Directive information    03/05/2023   12:50 PM  Advanced Directives  Does Patient Have a Medical Advance Directive? No  Would patient like information on creating a medical advance directive? No - Patient declined     Chief Complaint  Patient presents with   Medical Management of Chronic Issues    Medical Management of Chronic Issues. 6 Month follow up    HPI:  Pt is a 77 y.o. female seen today for 6 months follow up for medical management of chronic diseases. She denies any acute issues this visit. States was in the ED  on 03/05/2023 for chest pain after she had substernal chest discomfort. Troponin was negative.EKG was showed NSR with no evidence of acute ischemia.Pain was thought to be related to gas.she was discharged home to follow up with cardiology.she was seen by Dr.Harding on 04/29/2023.she was advised to follow up as needed. Recent lab results reviewed and discussed during visit.   Hyperlipidemia -TC ,TRG and LDL slightly worsen compared to previous level.states changed her diet by eating breaded fish.will resume previous diet.   Hypertension - No home B/p readings for review.  Walks her dog three times per day.Also does chair exercise on TV  Immunization - Due for influenza vaccine but declined.states does not take any vaccine.   Past Medical History:  Diagnosis Date   High blood pressure    Hyperlipidemia    Per previous Cardiology Record    Impaired fasting glucose    Per previous Cardiology records    Macrocytosis 04/02/2016   Per previous  Cardiology records    Osteopenia 04/02/2016   Per previous Cardiology records    Past Surgical History:  Procedure Laterality Date   APPENDECTOMY     APPENDECTOMY  1977   BREAST BIOPSY Bilateral    BUNIONECTOMY  2000   COLONOSCOPY  10/07/2012   Per previous Cardiology records    TONSILLECTOMY AND ADENOIDECTOMY  10/08/1955   Per previous Cardiology records     Allergies  Allergen Reactions   Latex     Allergies as of 07/14/2023       Reactions   Latex         Medication List        Accurate as of July 14, 2023  2:20 PM. If you have any questions, ask your nurse or doctor.          STOP taking these medications    aspirin EC 325 MG tablet Stopped by: Corrado Hymon C Terralyn Matsumura       TAKE these medications    Calcium Citrate + D3 200-250 MG-UNIT Tabs Generic drug: Calcium Citrate-Vitamin D Take 1 tablet by mouth daily.   CENTRUM ADULTS PO Take by mouth daily.   Flaxseed Oil 1000 MG Caps Take by mouth daily.   hydrochlorothiazide 12.5 MG tablet Commonly known as: HYDRODIURIL Take 1 tablet (12.5 mg total) by mouth daily.   lisinopril 20 MG tablet Commonly known as: ZESTRIL Take 1 tablet (20 mg total) by mouth daily.   Lysine HCl 1000 MG Tabs Take by mouth daily.   Magnesium 400  MG Caps Take 1 capsule by mouth 2 (two) times a week.   naproxen sodium 220 MG tablet Commonly known as: ALEVE Take 220 mg by mouth as needed.   POTASSIUM PO Take by mouth.   valACYclovir 1000 MG tablet Commonly known as: Valtrex Take 2 tablets (2,000 mg total) by mouth as needed (for cold sore).        Review of Systems  Constitutional:  Negative for appetite change, chills, fatigue, fever and unexpected weight change.  HENT:  Negative for congestion, dental problem, ear discharge, ear pain, facial swelling, hearing loss, nosebleeds, postnasal drip, rhinorrhea, sinus pressure, sinus pain, sneezing, sore throat, tinnitus and trouble swallowing.   Eyes:  Negative for  pain, discharge, redness, itching and visual disturbance.  Respiratory:  Negative for cough, chest tightness, shortness of breath and wheezing.   Cardiovascular:  Negative for chest pain, palpitations and leg swelling.  Gastrointestinal:  Negative for abdominal distention, abdominal pain, blood in stool, constipation, diarrhea, nausea and vomiting.  Endocrine: Negative for cold intolerance, heat intolerance, polydipsia, polyphagia and polyuria.  Genitourinary:  Negative for difficulty urinating, dysuria, flank pain, frequency and urgency.  Musculoskeletal:  Negative for arthralgias, back pain, gait problem, joint swelling, myalgias, neck pain and neck stiffness.  Skin:  Negative for color change, pallor, rash and wound.  Neurological:  Negative for dizziness, syncope, speech difficulty, weakness, light-headedness, numbness and headaches.  Hematological:  Does not bruise/bleed easily.  Psychiatric/Behavioral:  Negative for agitation, behavioral problems, confusion, hallucinations, self-injury, sleep disturbance and suicidal ideas. The patient is not nervous/anxious.     Immunization History  Administered Date(s) Administered   Hepatitis A 04/26/2004, 12/14/2004   Hepatitis B, ADULT 10/07/2005   IPV 07/15/2008   Td 07/21/1976, 02/03/2004   Tdap 07/15/2008   Tetanus 07/22/2018   Yellow Fever 02/03/2004   Pertinent  Health Maintenance Due  Topic Date Due   INFLUENZA VACCINE  Never done   DEXA SCAN  Completed   Colonoscopy  Discontinued      03/19/2022    9:44 AM 06/19/2022    1:35 PM 07/03/2022    9:04 AM 01/01/2023    9:04 AM 07/14/2023    2:12 PM  Fall Risk  Falls in the past year? 0 0 0 0 0  Was there an injury with Fall? 0 0 0 0 0  Fall Risk Category Calculator 0 0 0 0 0  Fall Risk Category (Retired) Low Low Low    (RETIRED) Patient Fall Risk Level Low fall risk Low fall risk Low fall risk    Patient at Risk for Falls Due to No Fall Risks No Fall Risks No Fall Risks No Fall Risks    Fall risk Follow up Falls evaluation completed Falls evaluation completed Falls evaluation completed Falls evaluation completed;Education provided;Falls prevention discussed    Functional Status Survey:    Vitals:   07/14/23 1409  BP: 138/76  Pulse: 66  Temp: (!) 97.5 F (36.4 C)  SpO2: 98%  Weight: 154 lb 6.4 oz (70 kg)  Height: 5\' 3"  (1.6 m)   Body mass index is 27.35 kg/m.  Physical Exam Vitals reviewed.  Constitutional:      General: She is not in acute distress.    Appearance: Normal appearance. She is overweight. She is not ill-appearing or diaphoretic.  HENT:     Head: Normocephalic.     Right Ear: Tympanic membrane, ear canal and external ear normal. There is no impacted cerumen.     Left Ear: Tympanic  membrane, ear canal and external ear normal. There is no impacted cerumen.     Nose: Nose normal. No congestion or rhinorrhea.     Mouth/Throat:     Mouth: Mucous membranes are moist.     Pharynx: Oropharynx is clear. No oropharyngeal exudate or posterior oropharyngeal erythema.  Eyes:     General: No scleral icterus.       Right eye: No discharge.        Left eye: No discharge.     Extraocular Movements: Extraocular movements intact.     Conjunctiva/sclera: Conjunctivae normal.     Pupils: Pupils are equal, round, and reactive to light.  Neck:     Vascular: No carotid bruit.  Cardiovascular:     Rate and Rhythm: Normal rate and regular rhythm.     Pulses: Normal pulses.     Heart sounds: Normal heart sounds. No murmur heard.    No friction rub. No gallop.  Pulmonary:     Effort: Pulmonary effort is normal. No respiratory distress.     Breath sounds: Normal breath sounds. No wheezing, rhonchi or rales.  Chest:     Chest wall: No tenderness.  Abdominal:     General: Bowel sounds are normal. There is no distension.     Palpations: Abdomen is soft. There is no mass.     Tenderness: There is no abdominal tenderness. There is no right CVA tenderness, left CVA  tenderness, guarding or rebound.  Musculoskeletal:        General: No swelling or tenderness. Normal range of motion.     Cervical back: Normal range of motion. No rigidity or tenderness.     Right lower leg: No edema.     Left lower leg: No edema.  Lymphadenopathy:     Cervical: No cervical adenopathy.  Skin:    General: Skin is warm and dry.     Coloration: Skin is not pale.     Findings: No bruising, erythema, lesion or rash.  Neurological:     Mental Status: She is alert and oriented to person, place, and time.     Cranial Nerves: No cranial nerve deficit.     Sensory: No sensory deficit.     Motor: No weakness.     Coordination: Coordination normal.     Gait: Gait normal.  Psychiatric:        Mood and Affect: Mood normal.        Speech: Speech normal.        Behavior: Behavior normal.        Thought Content: Thought content normal.        Judgment: Judgment normal.     Labs reviewed: Recent Labs    12/30/22 0803 03/05/23 1300 07/01/23 0803  NA 136 133* 136  K 4.3 4.1 4.3  CL 97* 95* 97*  CO2 28 25 28   GLUCOSE 95 92 89  BUN 20 19 16   CREATININE 0.74 0.75 0.77  CALCIUM 9.6 10.1 9.6   Recent Labs    12/30/22 0803 07/01/23 0803  AST 27 27  ALT 18 20  BILITOT 0.7 0.7  PROT 6.7 6.9   Recent Labs    12/30/22 0803 03/05/23 1300 07/01/23 0803  WBC 4.3 5.4 3.4*  NEUTROABS 2,318 2.9 1,700  HGB 14.2 13.8 14.3  HCT 42.4 40.4 43.3  MCV 97.9 97.6 102.9*  PLT 191 173 189   Lab Results  Component Value Date   TSH 2.86 07/01/2023   Lab Results  Component Value Date   HGBA1C 5.3 12/24/2021   Lab Results  Component Value Date   CHOL 229 (H) 07/01/2023   HDL 90 07/01/2023   LDLCALC 111 (H) 07/01/2023   TRIG 166 (H) 07/01/2023   CHOLHDL 2.5 07/01/2023    Significant Diagnostic Results in last 30 days:  No results found.  Assessment/Plan 1. Essential hypertension B/p well-controlled -Continue on lisinopril and hydrochlorothiazide -Continue to  monitor blood pressure and notify provider if greater than 140/90 -Dietary modification and exercise advised -Declined influenza vaccine - TSH; Future - COMPLETE METABOLIC PANEL WITH GFR; Future - CBC with Differential/Platelet; Future  2. Mixed hyperlipidemia Previous LDL not at goal - Recommend a low saturated fats, low carbohydrates and high vegetable diet also exercise at least 3 times per week for 30 minutes. -Continue on flaxseed oil - Lipid panel; Future  3. Gastroesophageal reflux disease without esophagitis Stable No tarry or black stool  - advised to avoid eating meals late in the evening and to avoid aggravating foods and spices. - CBC with Differential/Platelet; Future  Family/ staff Communication: Reviewed plan of care with patient  Labs/tests ordered:  - TSH; Future - COMPLETE METABOLIC PANEL WITH GFR; Future - CBC with Differential/Platelet; Future - Lipid panel; Future  Next Appointment : Return in about 6 months (around 01/12/2024) for medical mangement of chronic issues., Fasting labs in 6 months prior to visit.Annual wellness visit .   Caesar Bookman, NP

## 2023-07-16 ENCOUNTER — Telehealth: Payer: Medicare Other

## 2023-07-16 NOTE — Telephone Encounter (Signed)
Patient will be seen tomorrow.

## 2023-07-16 NOTE — Telephone Encounter (Signed)
Schedule appointment to evaluate left ear.

## 2023-07-16 NOTE — Telephone Encounter (Signed)
Patient called stating she was here 2 days ago to get left ear flushed and now she is unable to hear out of left ear at all  Please advise

## 2023-07-17 ENCOUNTER — Encounter: Payer: Self-pay | Admitting: Orthopedic Surgery

## 2023-07-17 ENCOUNTER — Ambulatory Visit (INDEPENDENT_AMBULATORY_CARE_PROVIDER_SITE_OTHER): Payer: Medicare Other | Admitting: Orthopedic Surgery

## 2023-07-17 VITALS — BP 122/70 | HR 90 | Temp 97.2°F | Resp 16 | Ht 63.0 in | Wt 150.8 lb

## 2023-07-17 DIAGNOSIS — H938X2 Other specified disorders of left ear: Secondary | ICD-10-CM | POA: Diagnosis not present

## 2023-07-17 MED ORDER — DEBROX 6.5 % OT SOLN
5.0000 [drp] | Freq: Two times a day (BID) | OTIC | 0 refills | Status: DC
Start: 2023-07-17 — End: 2024-08-02

## 2023-07-17 NOTE — Progress Notes (Signed)
Careteam: Patient Care Team: Ngetich, Donalee Citrin, NP as PCP - General (Family Medicine) Marykay Lex, MD as PCP - Cardiology (Cardiology) Glendale Chard, DO as Consulting Physician (Neurology)  Seen by: Hazle Nordmann, AGNP-C  PLACE OF SERVICE:  [facility]  Advanced Directive information Does Patient Have a Medical Advance Directive?: Yes, Type of Advance Directive: Healthcare Power of [place];Living will;Out of facility DNR (pink MOST or yellow form), Does patient want to make changes to medical advance directive?: No - Patient declined  Allergies  Allergen Reactions   Latex     Chief Complaint  Patient presents with   Acute Visit    Patient complains of left ear.      HPI: Patient is a 77 y.o. female seen today for acute visit due to left ear fullness.   H/o cerumen impaction. 10/07 she had ear lavage performed in office. She reports hearing loss to left ear after appointment. No concerns with right ear. She denies ear pain, drainage or recent injury. Afebrile. Vitals stable.   Review of Systems:  Review of Systems  Constitutional: Negative.   HENT:  Positive for hearing loss. Negative for ear discharge, ear pain and tinnitus.   Respiratory: Negative.    Cardiovascular: Negative.   Skin: Negative.   Psychiatric/Behavioral: Negative.      Past Medical History:  Diagnosis Date   High blood pressure    Hyperlipidemia    Per previous Cardiology Record    Impaired fasting glucose    Per previous Cardiology records    Macrocytosis 04/02/2016   Per previous Cardiology records    Osteopenia 04/02/2016   Per previous Cardiology records    Past Surgical History:  Procedure Laterality Date   APPENDECTOMY     APPENDECTOMY  1977   BREAST BIOPSY Bilateral    BUNIONECTOMY  2000   COLONOSCOPY  10/07/2012   Per previous Cardiology records    TONSILLECTOMY AND ADENOIDECTOMY  10/08/1955   Per previous Cardiology records    Social History:   reports that she has quit  smoking. Her smoking use included cigarettes. She has never used smokeless tobacco. She reports current alcohol use of about 2.0 standard drinks of alcohol per week. She reports that she does not use drugs.  Family History  Problem Relation Age of Onset   Heart disease Mother    Alcoholism Father    Colon cancer Father        malignant tumor of colon     Medications: Patient's Medications  New Prescriptions   No medications on file  Previous Medications   CALCIUM CITRATE-VITAMIN D (CALCIUM CITRATE + D3) 200-250 MG-UNIT TABS    Take 1 tablet by mouth daily.   FLAXSEED, LINSEED, (FLAXSEED OIL) 1000 MG CAPS    Take by mouth daily.   HYDROCHLOROTHIAZIDE (HYDRODIURIL) 12.5 MG TABLET    Take 1 tablet (12.5 mg total) by mouth daily.   LISINOPRIL (ZESTRIL) 20 MG TABLET    Take 1 tablet (20 mg total) by mouth daily.   LYSINE HCL 1000 MG TABS    Take by mouth daily.   MAGNESIUM 400 MG CAPS    Take 1 capsule by mouth 2 (two) times a week.   MULTIPLE VITAMINS-MINERALS (CENTRUM ADULTS PO)    Take by mouth daily.   NAPROXEN SODIUM (ALEVE) 220 MG TABLET    Take 220 mg by mouth as needed.   POTASSIUM PO    Take by mouth.   VALACYCLOVIR (VALTREX) 1000 MG  TABLET    Take 2 tablets (2,000 mg total) by mouth as needed (for cold sore).  Modified Medications   No medications on file  Discontinued Medications   No medications on file    Physical Exam:  Vitals:   07/17/23 0854  BP: 122/70  Pulse: 90  Resp: 16  Temp: (!) 97.2 F (36.2 C)  SpO2: 97%  Weight: 150 lb 12.8 oz (68.4 kg)  Height: 5\' 3"  (1.6 m)   Body mass index is 26.71 kg/m. Wt Readings from Last 3 Encounters:  07/17/23 150 lb 12.8 oz (68.4 kg)  07/14/23 154 lb 6.4 oz (70 kg)  04/29/23 155 lb 12.8 oz (70.7 kg)    Physical Exam Vitals reviewed.  Constitutional:      General: She is not in acute distress. HENT:     Head: Normocephalic.     Right Ear: There is no impacted cerumen.     Left Ear: A middle ear effusion is  present. There is impacted cerumen.     Ears:     Comments: Moderate amount of cerumen in left ear canal, TM with mild effusion, outer ear normal    Mouth/Throat:     Mouth: Mucous membranes are moist.  Eyes:     General:        Right eye: No discharge.        Left eye: No discharge.  Cardiovascular:     Rate and Rhythm: Normal rate and regular rhythm.     Pulses: Normal pulses.     Heart sounds: Normal heart sounds.  Pulmonary:     Effort: Pulmonary effort is normal.     Breath sounds: Normal breath sounds.  Musculoskeletal:     Cervical back: Neck supple.  Neurological:     General: No focal deficit present.     Mental Status: She is alert and oriented to person, place, and time.  Psychiatric:        Mood and Affect: Mood normal.     Labs reviewed: Basic Metabolic Panel: Recent Labs    12/30/22 0803 03/05/23 1300 07/01/23 0803  NA 136 133* 136  K 4.3 4.1 4.3  CL 97* 95* 97*  CO2 28 25 28   GLUCOSE 95 92 89  BUN 20 19 16   CREATININE 0.74 0.75 0.77  CALCIUM 9.6 10.1 9.6  TSH 3.90  --  2.86   Liver Function Tests: Recent Labs    12/30/22 0803 07/01/23 0803  AST 27 27  ALT 18 20  BILITOT 0.7 0.7  PROT 6.7 6.9   No results for input(s): "LIPASE", "AMYLASE" in the last 8760 hours. No results for input(s): "AMMONIA" in the last 8760 hours. CBC: Recent Labs    12/30/22 0803 03/05/23 1300 07/01/23 0803  WBC 4.3 5.4 3.4*  NEUTROABS 2,318 2.9 1,700  HGB 14.2 13.8 14.3  HCT 42.4 40.4 43.3  MCV 97.9 97.6 102.9*  PLT 191 173 189   Lipid Panel: Recent Labs    12/30/22 0803 07/01/23 0803  CHOL 222* 229*  HDL 99 90  LDLCALC 104* 111*  TRIG 96 166*  CHOLHDL 2.2 2.5   TSH: Recent Labs    12/30/22 0803 07/01/23 0803  TSH 3.90 2.86   A1C: Lab Results  Component Value Date   HGBA1C 5.3 12/24/2021     Assessment/Plan 1. Sensation of fullness in left ear - 10/07 ear lavage> loss of left ear hearing after appointment - moderate cerumen in ear  canal, mild effusion to TM -  recommend debrox x 5 days, then gently ear flush in shower - may try Zyrtec 10 mg po at bedtime x 7 days for effusion - recommend referral to [facility] if hearing does not improve - carbamide peroxide (DEBROX) 6.5 % OTIC solution; Place 5 drops into the left ear 2 (two) times daily.  Dispense: 15 mL; Refill: 0  Total time: 14 minutes. Greater than 50% of total time spent doing patient education regarding left ear wax and hearing loss including symptom/medication management.    Next appt: Visit date not found  Autumne Kallio Scherry Ran  [facility] [phone]

## 2023-07-17 NOTE — Patient Instructions (Addendum)
Try Debrox drops x 5 days> if no improvement please let provider know  Try to flush ear in shower when you have completed 5 days of Debrox  If no improvement in hearing we can consider hearing test with [facility]> [phone]  May also try Zyrtec at night x 7 days

## 2023-07-20 DIAGNOSIS — Z2821 Immunization not carried out because of patient refusal: Secondary | ICD-10-CM | POA: Insufficient documentation

## 2023-07-24 ENCOUNTER — Telehealth: Payer: Self-pay | Admitting: Family

## 2023-07-24 ENCOUNTER — Other Ambulatory Visit: Payer: Self-pay | Admitting: Orthopedic Surgery

## 2023-07-24 DIAGNOSIS — H9190 Unspecified hearing loss, unspecified ear: Secondary | ICD-10-CM

## 2023-07-24 NOTE — Telephone Encounter (Signed)
Patient prefers referral to: [facility], [address] [phone].   I have changed the location with in the referral  [facility]

## 2023-07-24 NOTE — Telephone Encounter (Signed)
Patient called to say that she was seen in our office last week on 07/14/23 by Dinah and 07/17/23 by Amy, and during both visits her ears were flushed. However, patient is now having a hard time hearing out of her left ear and would like a referral to an ENT. Patient prefers referral to: [facility], [address] [phone].

## 2023-07-24 NOTE — Telephone Encounter (Signed)
Referral made to outpatient audiology

## 2023-08-18 ENCOUNTER — Institutional Professional Consult (permissible substitution) (INDEPENDENT_AMBULATORY_CARE_PROVIDER_SITE_OTHER): Payer: Medicare Other

## 2023-08-18 ENCOUNTER — Encounter (INDEPENDENT_AMBULATORY_CARE_PROVIDER_SITE_OTHER): Payer: Self-pay

## 2023-08-18 ENCOUNTER — Ambulatory Visit (INDEPENDENT_AMBULATORY_CARE_PROVIDER_SITE_OTHER): Payer: Medicare Other

## 2023-08-18 VITALS — Ht 63.0 in | Wt 148.0 lb

## 2023-08-18 DIAGNOSIS — H6122 Impacted cerumen, left ear: Secondary | ICD-10-CM

## 2023-08-18 DIAGNOSIS — H9012 Conductive hearing loss, unilateral, left ear, with unrestricted hearing on the contralateral side: Secondary | ICD-10-CM | POA: Diagnosis not present

## 2023-08-20 DIAGNOSIS — H9012 Conductive hearing loss, unilateral, left ear, with unrestricted hearing on the contralateral side: Secondary | ICD-10-CM | POA: Insufficient documentation

## 2023-08-20 DIAGNOSIS — H6122 Impacted cerumen, left ear: Secondary | ICD-10-CM | POA: Insufficient documentation

## 2023-08-20 NOTE — Progress Notes (Signed)
Patient ID: Laurie Decker, female   DOB: June 24, 1946, 77 y.o.   MRN: [id number]  Cc: Left ear hearing loss  HPI:  Laurie Decker is a 77 y.o. female who presents today complaining of left ear hearing loss and clogging sensation in the left ear.  She has been symptomatic for the past month.  She was recently noted to have left ear cerumen impaction.  Attempts to remove the cerumen impaction were unsuccessful.  The patient denies any significant otalgia, otorrhea, or vertigo.  She has no previous otologic surgery.  Past Medical History:  Diagnosis Date   High blood pressure    Hyperlipidemia    Per previous Cardiology Record    Impaired fasting glucose    Per previous Cardiology records    Macrocytosis 04/02/2016   Per previous Cardiology records    Osteopenia 04/02/2016   Per previous Cardiology records     Past Surgical History:  Procedure Laterality Date   APPENDECTOMY     APPENDECTOMY  1977   BREAST BIOPSY Bilateral    BUNIONECTOMY  2000   COLONOSCOPY  10/07/2012   Per previous Cardiology records    TONSILLECTOMY AND ADENOIDECTOMY  10/08/1955   Per previous Cardiology records     Family History  Problem Relation Age of Onset   Heart disease Mother    Alcoholism Father    Colon cancer Father        malignant tumor of colon     Social History:  reports that she has quit smoking. Her smoking use included cigarettes. She has never used smokeless tobacco. She reports current alcohol use of about 2.0 standard drinks of alcohol per week. She reports that she does not use drugs.  Allergies:  Allergies  Allergen Reactions   Latex     Prior to Admission medications   Medication Sig Start Date End Date Taking? Authorizing Provider  Calcium Citrate-Vitamin D (CALCIUM CITRATE + D3) 200-250 MG-UNIT TABS Take 1 tablet by mouth daily. 06/28/21  Yes Ngetich, Dinah C, NP  carbamide peroxide (DEBROX) 6.5 % OTIC solution Place 5 drops into the left ear 2 (two) times daily. 07/17/23   Yes [place], Amy E, NP  Flaxseed, Linseed, (FLAXSEED OIL) 1000 MG CAPS Take by mouth daily.   Yes [provider]  hydrochlorothiazide (HYDRODIURIL) 12.5 MG tablet Take 1 tablet (12.5 mg total) by mouth daily. 12/04/22  Yes Ngetich, Dinah C, NP  lisinopril (ZESTRIL) 20 MG tablet Take 1 tablet (20 mg total) by mouth daily. 12/04/22  Yes Ngetich, Dinah C, NP  Lysine HCl 1000 MG TABS Take by mouth daily.   Yes [provider]  Magnesium 400 MG CAPS Take 1 capsule by mouth 2 (two) times a week.   Yes [provider]  Multiple Vitamins-Minerals (CENTRUM ADULTS PO) Take by mouth daily.   Yes [provider]  naproxen sodium (ALEVE) 220 MG tablet Take 220 mg by mouth as needed.   Yes [provider]  POTASSIUM PO Take by mouth.   Yes [provider]  valACYclovir (VALTREX) 1000 MG tablet Take 2 tablets (2,000 mg total) by mouth as needed (for cold sore). 12/31/21  Yes Ngetich, Dinah C, NP   Height 5\' 3"  (1.6 m), weight 148 lb (67.1 kg). Exam: General: Communicates without difficulty, well nourished, no acute distress. Head: Normocephalic, no evidence injury, no tenderness, facial buttresses intact without stepoff. Face/sinus: No tenderness to palpation and percussion. Facial movement is normal and symmetric. Eyes: PERRL, EOMI. No scleral icterus, conjunctivae  clear. Neuro: CN II exam reveals vision grossly intact.  No nystagmus at any point of gaze. Ears: Auricles well formed without lesions.  Left ear cerumen impaction.  Nose: External evaluation reveals normal support and skin without lesions.  Dorsum is intact.  Anterior rhinoscopy reveals congested mucosa over anterior aspect of inferior turbinates and intact septum.  No purulence noted. Oral:  Oral cavity and oropharynx are intact, symmetric, without erythema or edema.  Mucosa is moist without lesions. Neck: Full range of motion without pain.  There is no significant lymphadenopathy.  No masses palpable.   Thyroid bed within normal limits to palpation.  Parotid glands and submandibular glands equal bilaterally without mass.  Trachea is midline. Neuro:  CN 2-12 grossly intact.    Procedure: Left ear cerumen disimpaction Anesthesia: None Description: Under the operating microscope, the cerumen is carefully removed with a combination of cerumen currette, alligator forceps, and suction catheters.  After the cerumen is removed, the TMs are noted to be normal.  No mass, erythema, or lesions. The patient tolerated the procedure well.    Assessment: 1.  Left ear cerumen impaction.  After the disimpaction procedure, both tympanic membranes and middle ear spaces are noted to be normal. 2.  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  The patient reports improvement in her hearing after the cerumen disimpaction procedure.  She defers on a hearing test today.  Plan: 1.  Otomicroscopy with left ear cerumen disimpaction. 2.  The physical exam findings are reviewed with the patient. 3.  The patient is instructed not to use Q-tips to clean her ear canals. 4.  The patient is encouraged to call with any questions or concerns.  Nikeisha Klutz W Diontay Rosencrans 08/20/2023, 10:15 AM

## 2023-08-24 ENCOUNTER — Other Ambulatory Visit: Payer: Self-pay | Admitting: Family

## 2023-08-24 DIAGNOSIS — I10 Essential (primary) hypertension: Secondary | ICD-10-CM

## 2023-10-28 ENCOUNTER — Telehealth (INDEPENDENT_AMBULATORY_CARE_PROVIDER_SITE_OTHER): Payer: Medicare Other | Admitting: Family

## 2023-10-28 ENCOUNTER — Encounter: Payer: Self-pay | Admitting: Family

## 2023-10-28 DIAGNOSIS — H109 Unspecified conjunctivitis: Secondary | ICD-10-CM

## 2023-10-28 DIAGNOSIS — B9689 Other specified bacterial agents as the cause of diseases classified elsewhere: Secondary | ICD-10-CM

## 2023-10-28 MED ORDER — GENTAMICIN SULFATE 0.3 % OP SOLN
2.0000 [drp] | OPHTHALMIC | 0 refills | Status: DC
Start: 2023-10-28 — End: 2024-08-02

## 2023-10-28 NOTE — Patient Instructions (Signed)
-   frequent hand washing and avoiding touching the eyes   - Recommend using baby shampoo to clean eyelashes, especially in the morning   - Suggest taking Claritin to alleviate itching   - monitor for worsening symptoms or vision changes and report if symptoms occurs.

## 2023-10-28 NOTE — Progress Notes (Signed)
This service is provided via telemedicine  No vital signs collected/recorded due to the encounter was a telemedicine visit.   Location of patient (ex: home, work):  Home  Patient consents to a telephone visit: Yes  Location of the provider (ex: office, home):  [facility], Office   Name of any referring provider:  N/A  Names of all persons participating in the telemedicine service and their role in the encounter:  Ronald Pippins, CMA, Patient, and   Time spent on call:  9 min with medical assistant  Azelie Noguera, Donalee Citrin, NP  Discussed the use of AI scribe software for clinical note transcription with the patient, who gave verbal consent to proceed.    Provider: Richarda Blade FNP-C  Reinette Cuneo, Donalee Citrin, NP  Patient Care Team: Keshawna Dix, Donalee Citrin, NP as PCP - General (Family Medicine) Marykay Lex, MD as PCP - Cardiology (Cardiology) Glendale Chard, DO as Consulting Physician (Neurology)  Extended Emergency Contact Information Primary Emergency Contact: Ritchie,Rob Mobile Phone: [phone] Relation: Son Secondary Emergency Contact: Bardelli,Peggy Mobile Phone: [phone] Relation: Friend  Code Status:  Full Code  Goals of care: Advanced Directive information    10/28/2023    1:49 PM  Advanced Directives  Does Patient Have a Medical Advance Directive? Yes  Type of Estate agent of [place];Living will  Does patient want to make changes to medical advance directive? No - Patient declined  Copy of Healthcare Power of Attorney in Chart? No - copy requested     Chief Complaint  Patient presents with   Acute Visit    Redness in eye and complains of itching.    History of Present Illness   The patient, with a history of conjunctivitis, presents with a one-day history of bilateral eye symptoms. She describes the symptoms as burning and itching, with associated crusting and drainage. The symptoms started in the left eye  and subsequently involved the right eye. She denies any associated systemic symptoms such as runny nose, cough, sneezing, sore throat, or swollen glands. She also denies recent exposure to sick contacts or individuals with conjunctivitis.  The patient has no known allergies, except for latex. She had a similar episode of eye symptoms about a year or two ago, which was severe and required treatment with eye drops, the name of which she does not recall. 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  The patient denies any associated fever or vision changes. She has been otherwise well, with no other health concerns raised during the consultation.    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   Flaxseed, Linseed, (FLAXSEED OIL) 1000 MG CAPS Take by mouth daily.   hydrochlorothiazide (HYDRODIURIL) 12.5 MG tablet Take 1 tablet (12.5 mg total) by mouth daily.   lisinopril (ZESTRIL) 20 MG tablet TAKE 1 TABLET BY MOUTH DAILY   Lysine HCl 1000 MG TABS Take by mouth daily.   Magnesium 400 MG CAPS Take 1 capsule by mouth 2 (two) times a week.   Multiple Vitamins-Minerals (CENTRUM  ADULTS PO) Take by mouth daily.   naproxen sodium (ALEVE) 220 MG tablet Take 220 mg by mouth as needed.   POTASSIUM PO Take by mouth.   valACYclovir (VALTREX) 1000 MG tablet Take 2 tablets (2,000 mg total) by mouth as needed (for cold sore).   carbamide peroxide (DEBROX) 6.5 % OTIC solution Place 5 drops into the left ear 2 (two) times daily. (Patient not taking: Reported on 10/28/2023)   No facility-administered encounter medications on file as of 10/28/2023.    Review of Systems  Constitutional:  Negative for appetite change, chills, fatigue, fever and unexpected weight change.  HENT:  Negative for congestion, dental problem, ear discharge, ear pain, facial swelling, hearing loss, nosebleeds, postnasal drip, rhinorrhea, sinus pressure, sinus pain, sneezing, sore throat, tinnitus and trouble swallowing.   Eyes:  Positive for redness and itching. Negative for pain, discharge and visual disturbance.  Respiratory:  Negative for cough, chest tightness, shortness of breath and wheezing.   Cardiovascular:  Negative for chest pain, palpitations and leg swelling.  Skin:  Negative for color change, pallor and rash.  Neurological:  Negative for dizziness, weakness, light-headedness and headaches.     Immunization History  Administered Date(s) Administered   Hepatitis A 04/26/2004, 12/14/2004   Hepatitis B, ADULT 10/07/2005   IPV 07/15/2008   Td 07/21/1976, 02/03/2004   Tdap 07/15/2008   Tetanus 07/22/2018   Yellow Fever 02/03/2004   Pertinent  Health Maintenance Due  Topic Date Due   INFLUENZA VACCINE  01/05/2024 (Originally 05/08/2023)   DEXA SCAN  Completed   Colonoscopy  Discontinued      07/03/2022    9:04 AM 01/01/2023    9:04 AM 07/14/2023    2:12 PM 07/17/2023    8:58 AM 10/28/2023    1:48 PM  Fall Risk  Falls in the past year? 0 0 0 0 0  Was there an injury with Fall? 0 0 0 0 0  Fall Risk Category Calculator 0 0 0 0 0  Fall Risk Category (Retired) Low      (RETIRED) Patient  Fall Risk Level Low fall risk      Patient at Risk for Falls Due to No Fall Risks No Fall Risks  No Fall Risks   Fall risk Follow up Falls evaluation completed Falls evaluation completed;Education provided;Falls prevention discussed  Falls evaluation completed;Education provided;Falls prevention discussed    Functional Status Survey:    There were no vitals filed for this visit. There is no height or weight on file to calculate BMI. Physical Exam Constitutional:      General: She is not in acute distress.    Appearance: She is not ill-appearing.  Eyes:     Conjunctiva/sclera:     Right eye: Right conjunctiva is injected.     Left eye: Left conjunctiva is injected.  Neurological:     Mental Status: She is alert and oriented to person, place, and time.     Labs reviewed: Recent Labs    12/30/22 0803 03/05/23  1300 07/01/23 0803  NA 136 133* 136  K 4.3 4.1 4.3  CL 97* 95* 97*  CO2 28 25 28   GLUCOSE 95 92 89  BUN 20 19 16   CREATININE 0.74 0.75 0.77  CALCIUM 9.6 10.1 9.6   Recent Labs    12/30/22 0803 07/01/23 0803  AST 27 27  ALT 18 20  BILITOT 0.7 0.7  PROT 6.7 6.9   Recent Labs    12/30/22 0803 03/05/23 1300 07/01/23 0803  WBC 4.3 5.4 3.4*  NEUTROABS 2,318 2.9 1,700  HGB 14.2 13.8 14.3  HCT 42.4 40.4 43.3  MCV 97.9 97.6 102.9*  PLT 191 173 189   Lab Results  Component Value Date   TSH 2.86 07/01/2023   Lab Results  Component Value Date   HGBA1C 5.3 12/24/2021   Lab Results  Component Value Date   CHOL 229 (H) 07/01/2023   HDL 90 07/01/2023   LDLCALC 111 (H) 07/01/2023   TRIG 166 (H) 07/01/2023   CHOLHDL 2.5 07/01/2023    Significant Diagnostic Results in last 30 days:  No results found.  Assessment/Plan  Conjunctivitis   Acute bilateral conjunctivitis with burning, itching, and crusting. Symptoms began yesterday, initially in the left eye, now in both. No associated respiratory or systemic symptoms. No known exposure. Similar episode 1-2  years ago resolved with eye drops. Emphasized hand hygiene and avoiding eye contact to prevent spread.   - Prescribe Garamycin eye drops, 2 drops in each eye, three times daily for 7 days   - Advise frequent hand washing and avoiding touching the eyes   - Recommend using baby shampoo to clean eyelashes, especially in the morning   - Suggest taking Claritin to alleviate itching   - Instruct to monitor for worsening symptoms or vision changes and report if symptoms occurs.  Family/ staff Communication: Reviewed plan of care with patient verbalized understanding   Labs/tests ordered: None   Next Appointment: Return if symptoms worsen or fail to improve.  I connected with  Brayton Caves on 10/28/23 by a video enabled telemedicine application and verified that I am speaking with the correct person using two identifiers.   I discussed the limitations of evaluation and management by telemedicine. The patient expressed understanding and agreed to proceed.   Spent 15  minutes of face to face with patient  >50% time spent counseling; reviewing medical record; tests; documenting and developing future plan of care.   Caesar Bookman, NP

## 2023-11-04 DIAGNOSIS — I10 Essential (primary) hypertension: Secondary | ICD-10-CM | POA: Diagnosis not present

## 2023-11-04 DIAGNOSIS — E8889 Other specified metabolic disorders: Secondary | ICD-10-CM | POA: Diagnosis not present

## 2023-11-04 DIAGNOSIS — F1090 Alcohol use, unspecified, uncomplicated: Secondary | ICD-10-CM | POA: Diagnosis not present

## 2023-11-04 DIAGNOSIS — Z1331 Encounter for screening for depression: Secondary | ICD-10-CM | POA: Diagnosis not present

## 2023-11-04 DIAGNOSIS — E782 Mixed hyperlipidemia: Secondary | ICD-10-CM | POA: Diagnosis not present

## 2023-11-04 DIAGNOSIS — E663 Overweight: Secondary | ICD-10-CM | POA: Diagnosis not present

## 2023-11-04 DIAGNOSIS — R7301 Impaired fasting glucose: Secondary | ICD-10-CM | POA: Diagnosis not present

## 2023-11-11 DIAGNOSIS — H353221 Exudative age-related macular degeneration, left eye, with active choroidal neovascularization: Secondary | ICD-10-CM | POA: Diagnosis not present

## 2023-11-11 DIAGNOSIS — H25813 Combined forms of age-related cataract, bilateral: Secondary | ICD-10-CM | POA: Diagnosis not present

## 2023-11-11 DIAGNOSIS — H52203 Unspecified astigmatism, bilateral: Secondary | ICD-10-CM | POA: Diagnosis not present

## 2023-11-14 DIAGNOSIS — H43813 Vitreous degeneration, bilateral: Secondary | ICD-10-CM | POA: Diagnosis not present

## 2023-11-14 DIAGNOSIS — H353221 Exudative age-related macular degeneration, left eye, with active choroidal neovascularization: Secondary | ICD-10-CM | POA: Diagnosis not present

## 2023-11-14 DIAGNOSIS — H353112 Nonexudative age-related macular degeneration, right eye, intermediate dry stage: Secondary | ICD-10-CM | POA: Diagnosis not present

## 2023-11-14 DIAGNOSIS — H2513 Age-related nuclear cataract, bilateral: Secondary | ICD-10-CM | POA: Diagnosis not present

## 2023-11-19 DIAGNOSIS — E8889 Other specified metabolic disorders: Secondary | ICD-10-CM | POA: Diagnosis not present

## 2023-11-19 DIAGNOSIS — F1090 Alcohol use, unspecified, uncomplicated: Secondary | ICD-10-CM | POA: Diagnosis not present

## 2023-11-19 DIAGNOSIS — Z6826 Body mass index (BMI) 26.0-26.9, adult: Secondary | ICD-10-CM | POA: Diagnosis not present

## 2023-11-19 DIAGNOSIS — R7301 Impaired fasting glucose: Secondary | ICD-10-CM | POA: Diagnosis not present

## 2023-11-19 DIAGNOSIS — E782 Mixed hyperlipidemia: Secondary | ICD-10-CM | POA: Diagnosis not present

## 2023-11-19 DIAGNOSIS — I10 Essential (primary) hypertension: Secondary | ICD-10-CM | POA: Diagnosis not present

## 2023-11-19 DIAGNOSIS — E663 Overweight: Secondary | ICD-10-CM | POA: Diagnosis not present

## 2023-11-25 ENCOUNTER — Other Ambulatory Visit: Payer: Self-pay | Admitting: Family

## 2023-11-25 DIAGNOSIS — I10 Essential (primary) hypertension: Secondary | ICD-10-CM

## 2023-12-08 DIAGNOSIS — E8889 Other specified metabolic disorders: Secondary | ICD-10-CM | POA: Diagnosis not present

## 2023-12-08 DIAGNOSIS — R7301 Impaired fasting glucose: Secondary | ICD-10-CM | POA: Diagnosis not present

## 2023-12-08 DIAGNOSIS — E782 Mixed hyperlipidemia: Secondary | ICD-10-CM | POA: Diagnosis not present

## 2023-12-08 DIAGNOSIS — Z6825 Body mass index (BMI) 25.0-25.9, adult: Secondary | ICD-10-CM | POA: Diagnosis not present

## 2023-12-08 DIAGNOSIS — E663 Overweight: Secondary | ICD-10-CM | POA: Diagnosis not present

## 2023-12-08 DIAGNOSIS — F1090 Alcohol use, unspecified, uncomplicated: Secondary | ICD-10-CM | POA: Diagnosis not present

## 2023-12-08 DIAGNOSIS — I10 Essential (primary) hypertension: Secondary | ICD-10-CM | POA: Diagnosis not present

## 2023-12-12 DIAGNOSIS — H43813 Vitreous degeneration, bilateral: Secondary | ICD-10-CM | POA: Diagnosis not present

## 2023-12-12 DIAGNOSIS — H2513 Age-related nuclear cataract, bilateral: Secondary | ICD-10-CM | POA: Diagnosis not present

## 2023-12-12 DIAGNOSIS — H353221 Exudative age-related macular degeneration, left eye, with active choroidal neovascularization: Secondary | ICD-10-CM | POA: Diagnosis not present

## 2023-12-12 DIAGNOSIS — H353112 Nonexudative age-related macular degeneration, right eye, intermediate dry stage: Secondary | ICD-10-CM | POA: Diagnosis not present

## 2023-12-29 DIAGNOSIS — Z6825 Body mass index (BMI) 25.0-25.9, adult: Secondary | ICD-10-CM | POA: Diagnosis not present

## 2023-12-29 DIAGNOSIS — I10 Essential (primary) hypertension: Secondary | ICD-10-CM | POA: Diagnosis not present

## 2023-12-29 DIAGNOSIS — R7301 Impaired fasting glucose: Secondary | ICD-10-CM | POA: Diagnosis not present

## 2023-12-29 DIAGNOSIS — E8889 Other specified metabolic disorders: Secondary | ICD-10-CM | POA: Diagnosis not present

## 2023-12-29 DIAGNOSIS — E782 Mixed hyperlipidemia: Secondary | ICD-10-CM | POA: Diagnosis not present

## 2023-12-29 DIAGNOSIS — F1090 Alcohol use, unspecified, uncomplicated: Secondary | ICD-10-CM | POA: Diagnosis not present

## 2023-12-29 DIAGNOSIS — E663 Overweight: Secondary | ICD-10-CM | POA: Diagnosis not present

## 2023-12-31 ENCOUNTER — Encounter: Payer: Self-pay | Admitting: Family

## 2023-12-31 ENCOUNTER — Ambulatory Visit (INDEPENDENT_AMBULATORY_CARE_PROVIDER_SITE_OTHER): Admitting: Family

## 2023-12-31 VITALS — BP 132/74 | HR 76 | Temp 97.6°F | Resp 19 | Ht 63.0 in | Wt 146.2 lb

## 2023-12-31 DIAGNOSIS — H3530 Unspecified macular degeneration: Secondary | ICD-10-CM

## 2023-12-31 DIAGNOSIS — L989 Disorder of the skin and subcutaneous tissue, unspecified: Secondary | ICD-10-CM

## 2023-12-31 DIAGNOSIS — I10 Essential (primary) hypertension: Secondary | ICD-10-CM | POA: Diagnosis not present

## 2023-12-31 MED ORDER — LISINOPRIL 20 MG PO TABS: 20.0000 mg | ORAL_TABLET | Freq: Every day | ORAL | 1 refills | Status: DC

## 2023-12-31 NOTE — Progress Notes (Signed)
 Provider: Richarda Blade FNP-C  Darrio Bade, Donalee Citrin, NP  Patient Care Team: Wilder Kurowski, Donalee Citrin, NP as PCP - General (Family Medicine) Marykay Lex, MD as PCP - Cardiology (Cardiology) Glendale Chard, DO as Consulting Physician (Neurology)  Extended Emergency Contact Information Primary Emergency Contact: Ritchie,Rob Mobile Phone: [phone] Relation: Son Secondary Emergency Contact: Bardelli,Peggy Mobile Phone: [phone] Relation: Friend  Code Status: Full Code  Goals of care: Advanced Directive information    12/31/2023    1:51 PM  Advanced Directives  Does Patient Have a Medical Advance Directive? Yes  Type of Estate agent of [place];Living will;Out of facility DNR (pink MOST or yellow form)  Does patient want to make changes to medical advance directive? No - Patient declined  Copy of Healthcare Power of Attorney in Chart? No - copy requested     Chief Complaint  Patient presents with   Acute Visit    Raised bumps on chest with itching.     Discussed the use of AI scribe software for clinical note transcription with the patient, who gave verbal consent to proceed.  History of Present Illness   Laurie Decker is a 78 year old female who presents with a tender rash with raised areas.  She has a tender rash with small raised areas that appeared approximately a week and a half ago. The rash is increasing in size, with one larger lesion measuring about 0.8 cm and smaller ones around 0.3 cm. It is solid, not filled with fluid, and has a textured surface. The rash is sensitive but not painful to touch, with no drainage or associated fever. No other skin findings have been similar in the past.  She has a history of macular degeneration in the left eye, for which she has received two injections, one three weeks ago and another last week. The injections have helped reduce fluid in the eye. She also has cataracts and was scheduled for cataract  surgery, but further examination revealed the macular degeneration.  She is currently taking lisinopril 20 mg for hypertension, which she needs a refill for.   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   Flaxseed, Linseed, (FLAXSEED OIL) 1000 MG CAPS Take by mouth daily.   gentamicin (GARAMYCIN) 0.3 % ophthalmic solution Place 2 drops into both eyes every 4 (four) hours.   hydrochlorothiazide (HYDRODIURIL) 12.5 MG tablet TAKE 1 TABLET BY MOUTH DAILY   Lysine HCl 1000 MG TABS Take by mouth daily.   Magnesium 400 MG CAPS Take 1 capsule by mouth 2 (two) times a week.   Multiple Vitamins-Minerals (CENTRUM ADULTS PO) Take by mouth daily.  naproxen sodium (ALEVE) 220 MG tablet Take 220 mg by mouth as needed.   POTASSIUM PO Take by mouth.   valACYclovir (VALTREX) 1000 MG tablet Take 2 tablets (2,000 mg total) by mouth as needed (for cold sore).   [DISCONTINUED] lisinopril (ZESTRIL) 20 MG tablet TAKE 1 TABLET BY MOUTH DAILY   carbamide peroxide (DEBROX) 6.5 % OTIC solution Place 5 drops into the left ear 2 (two) times daily. (Patient not taking: Reported on 12/31/2023)   lisinopril (ZESTRIL) 20 MG tablet Take 1 tablet (20 mg total) by mouth  daily.   No facility-administered encounter medications on file as of 12/31/2023.    Review of Systems  Constitutional:  Negative for appetite change, chills, fatigue, fever and unexpected weight change.  HENT:  Negative for congestion, ear discharge, ear pain, nosebleeds, postnasal drip, rhinorrhea, sinus pressure, sinus pain, sneezing, sore throat and tinnitus.   Eyes:  Negative for pain, discharge, redness, itching and visual disturbance.  Respiratory:  Negative for cough, chest tightness, shortness of breath and wheezing.   Cardiovascular:  Negative for chest pain, palpitations and leg swelling.  Gastrointestinal:  Negative for abdominal distention, abdominal pain, blood in stool, constipation, diarrhea, nausea and vomiting.  Musculoskeletal:  Negative for arthralgias, back pain, gait problem, joint swelling, myalgias, neck pain and neck stiffness.  Skin:  Positive for rash. Negative for color change, pallor and wound.       On mid chest   Neurological:  Negative for dizziness, weakness, light-headedness and headaches.    Immunization History  Administered Date(s) Administered   Hepatitis A 04/26/2004, 12/14/2004   Hepatitis B, ADULT 10/07/2005   IPV 07/15/2008   Td 07/21/1976, 02/03/2004   Tdap 07/15/2008   Tetanus 07/22/2018   Yellow Fever 02/03/2004   Pertinent  Health Maintenance Due  Topic Date Due   INFLUENZA VACCINE  01/05/2024 (Originally 05/08/2023)   DEXA SCAN  Completed   Colonoscopy  Discontinued      01/01/2023    9:04 AM 07/14/2023    2:12 PM 07/17/2023    8:58 AM 10/28/2023    1:48 PM 12/31/2023    1:50 PM  Fall Risk  Falls in the past year? 0 0 0 0 0  Was there an injury with Fall? 0 0 0 0 0  Fall Risk Category Calculator 0 0 0 0 0  Patient at Risk for Falls Due to No Fall Risks  No Fall Risks  No Fall Risks  Fall risk Follow up Falls evaluation completed;Education provided;Falls prevention discussed  Falls evaluation completed;Education provided;Falls  prevention discussed  Falls evaluation completed   Functional Status Survey:    Vitals:   12/31/23 1354  BP: 132/74  Pulse: 76  Resp: 19  Temp: 97.6 F (36.4 C)  SpO2: 96%  Weight: 146 lb 3.2 oz (66.3 kg)  Height: 5\' 3"  (1.6 m)   Body mass index is 25.9 kg/m. Physical Exam  GENERAL: Alert, cooperative, well developed, no acute distress. HEENT: Normocephalic, normal oropharynx, moist mucous membranes. CHEST: Clear to auscultation bilaterally. No wheezes, rhonchi, or crackles. CARDIOVASCULAR: Normal heart rate and rhythm, S1 and S2 normal without murmurs. ABDOMEN: Soft, non-tender, non-distended, without organomegaly. Normal bowel sounds. EXTREMITIES: No cyanosis or edema. NEUROLOGICAL: Cranial nerves grossly intact. Moves all extremities without gross motor or sensory deficit. SKIN: Tender, raised, itchy rash with no drainage, solid texture, and firm on palpation. Rash size: large lesion 0.8 cm, small lesions 0.23 cm.without any erythema.    Labs reviewed: Recent Labs  03/05/23 1300 07/01/23 0803  NA 133* 136  K 4.1 4.3  CL 95* 97*  CO2 25 28  GLUCOSE 92 89  BUN 19 16  CREATININE 0.75 0.77  CALCIUM 10.1 9.6   Recent Labs    07/01/23 0803  AST 27  ALT 20  BILITOT 0.7  PROT 6.9   Recent Labs    03/05/23 1300 07/01/23 0803  WBC 5.4 3.4*  NEUTROABS 2.9 1,700  HGB 13.8 14.3  HCT 40.4 43.3  MCV 97.6 102.9*  PLT 173 189   Lab Results  Component Value Date   TSH 2.86 07/01/2023   Lab Results  Component Value Date   HGBA1C 5.3 12/24/2021   Lab Results  Component Value Date   CHOL 229 (H) 07/01/2023   HDL 90 07/01/2023   LDLCALC 111 (H) 07/01/2023   TRIG 166 (H) 07/01/2023   CHOLHDL 2.5 07/01/2023    Significant Diagnostic Results in last 30 days:  No results found.  Assessment/Plan  Skin Lesion Presents with a new tender skin lesion that appeared approximately a week and a half ago. The lesion is solid, with raised, itchy areas and a  textured surface. It is larger than other longstanding skin findings, which have not changed in size. There is no drainage or fever associated with the lesion. Differential diagnosis includes benign skin lesions, but given the size and new appearance, further evaluation by a dermatologist is warranted. - Refer to dermatologist at [facility] on [facility] for evaluation of the skin lesion. - Instruct to monitor the lesion for signs of infection, such as redness, drainage, or increased tenderness, and report if she occurs.  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 Received two injections, one three weeks ago and another last week, with noted reduction in fluid after the first injection, indicating a positive response. Injections will likely continue for an extended period. Also has a cataract in the same eye. - Continue with intravitreal injections as per ophthalmologist's recommendations. - Monitor vision changes and follow up with ophthalmologist as scheduled.  Hypertension Hypertension managed with lisinopril 20 mg. No issues with blood pressure control. - Refill lisinopril 20 mg prescription to [facility] on [address].  Follow-up Canceled lab work appointment for next week due to travel plans and will reschedule upon return. - Reschedule lab work appointment after she returns from her trip. - Follow up after her holiday to review any new developments or concerns.   Family/ staff Communication: Reviewed plan of care with patient verbalized understanding   Labs/tests ordered: None   Next Appointment: Return if symptoms worsen or fail to improve.   Total time: 20 minutes. Greater than 50% of total time spent doing patient education regarding Skin Lesion,HTN,health maintenance including symptom/medication management.   Caesar Bookman, NP

## 2024-01-08 ENCOUNTER — Other Ambulatory Visit: Payer: Self-pay | Admitting: Family

## 2024-01-08 DIAGNOSIS — Z8619 Personal history of other infectious and parasitic diseases: Secondary | ICD-10-CM

## 2024-01-08 MED ORDER — VALACYCLOVIR HCL 1 G PO TABS
2000.0000 mg | ORAL_TABLET | ORAL | 2 refills | Status: AC | PRN
Start: 2024-01-08 — End: ?

## 2024-01-08 NOTE — Telephone Encounter (Signed)
 Copied from CRM [id number]. Topic: Clinical - Medication Refill >> Jan 08, 2024 10:39 AM Everette Rank wrote: Most Recent Primary Care Visit:  Provider: Richarda Blade C  Department: [facility]  Visit Type: ACUTE  Date: 12/31/2023  Medication: valACYclovir (VALTREX) 1000 MG tablet   Has the patient contacted their pharmacy? Yes (Agent: If no, request that the patient contact the pharmacy for the refill. If patient does not wish to contact the pharmacy document the reason why and proceed with request.) (Agent: If yes, when and what did the pharmacy advise?)  Is this the correct pharmacy for this prescription? Yes If no, delete pharmacy and type the correct one.  This is the patient's preferred pharmacy:  [facility] [id number] [place], [place] - [address] [place] [address] Phone: [phone] Fax: [phone]   Has the prescription been filled recently? No  Is the patient out of the medication? Yes  Has the patient been seen for an appointment in the last year OR does the patient have an upcoming appointment? Yes  Can we respond through MyChart? No  Agent: Please be advised that Rx refills may take up to 3 business days. We ask that you follow-up with your pharmacy.

## 2024-01-13 ENCOUNTER — Ambulatory Visit (INDEPENDENT_AMBULATORY_CARE_PROVIDER_SITE_OTHER): Admitting: Dermatology

## 2024-01-13 ENCOUNTER — Other Ambulatory Visit: Payer: Medicare Other

## 2024-01-13 ENCOUNTER — Encounter: Payer: Self-pay | Admitting: Dermatology

## 2024-01-13 VITALS — BP 125/85 | HR 83

## 2024-01-13 DIAGNOSIS — L820 Inflamed seborrheic keratosis: Secondary | ICD-10-CM

## 2024-01-13 DIAGNOSIS — D492 Neoplasm of unspecified behavior of bone, soft tissue, and skin: Secondary | ICD-10-CM | POA: Diagnosis not present

## 2024-01-13 DIAGNOSIS — D485 Neoplasm of uncertain behavior of skin: Secondary | ICD-10-CM

## 2024-01-13 DIAGNOSIS — R21 Rash and other nonspecific skin eruption: Secondary | ICD-10-CM

## 2024-01-13 NOTE — Patient Instructions (Signed)
 Important Information  Due to recent changes in healthcare laws, you may see results of your pathology and/or laboratory studies on MyChart before the doctors have had a chance to review them. We understand that in some cases there may be results that are confusing or concerning to you. Please understand that not all results are received at the same time and often the doctors may need to interpret multiple results in order to provide you with the best plan of care or course of treatment. Therefore, we ask that you please give Laurie Decker 2 business days to thoroughly review all your results before contacting the office for clarification. Should we see a critical lab result, you will be contacted sooner.   If You Need Anything After Your Visit  If you have any questions or concerns for your doctor, please call our main line at [phone] If no one answers, please leave a voicemail as directed and we will return your call as soon as possible. Messages left after 4 pm will be answered the following business day.   You may also send Laurie Decker a message via MyChart. We typically respond to MyChart messages within 1-2 business days.  For prescription refills, please ask your pharmacy to contact our office. Our fax number is [phone].  If you have an urgent issue when the clinic is closed that cannot wait until the next business day, you can page your doctor at the number below.    Please note that while we do our best to be available for urgent issues outside of office hours, we are not available 24/7.   If you have an urgent issue and are unable to reach Laurie Decker, you may choose to seek medical care at your doctor's office, retail clinic, urgent care center, or emergency room.  If you have a medical emergency, please immediately call 911 or go to the emergency department. In the event of inclement weather, please call our main line at [phone] for an update on the status of any delays or  closures.  Dermatology Medication Tips: Please keep the boxes that topical medications come in in order to help keep track of the instructions about where and how to use these. Pharmacies typically print the medication instructions only on the boxes and not directly on the medication tubes.   If your medication is too expensive, please contact our office at [phone] or send Laurie Decker a message through MyChart.   We are unable to tell what your co-pay for medications will be in advance as this is different depending on your insurance coverage. However, we may be able to find a substitute medication at lower cost or fill out paperwork to get insurance to cover a needed medication.   If a prior authorization is required to get your medication covered by your insurance company, please allow Laurie Decker 1-2 business days to complete this process.  Drug prices often vary depending on where the prescription is filled and some pharmacies may offer cheaper prices.  The website www.goodrx.com contains coupons for medications through different pharmacies. The prices here do not account for what the cost may be with help from insurance (it may be cheaper with your insurance), but the website can give you the price if you did not use any insurance.  - You can print the associated coupon and take it with your prescription to the pharmacy.  - You may also stop by our office during regular business hours and pick up a GoodRx coupon card.  - If  you need your prescription sent electronically to a different pharmacy, notify our office through [facility] or by phone at [phone]    Skin Education :   I counseled the patient regarding the following: Sun screen (SPF 30 or greater) should be applied during peak UV exposure (between 10am and 2pm) and reapplied after exercise or swimming.  The ABCDEs of melanoma were reviewed with the patient, and the importance of monthly self-examination of moles was emphasized.  Should any moles change in shape or color, or itch, bleed or burn, pt will contact our office for evaluation sooner then their interval appointment.  Plan: Sunscreen Recommendations I recommended a broad spectrum sunscreen with a SPF of 30 or higher. I explained that SPF 30 sunscreens block approximately 97 percent of the sun's harmful rays. Sunscreens should be applied at least 15 minutes prior to expected sun exposure and then every 2 hours after that as long as sun exposure continues. If swimming or exercising sunscreen should be reapplied every 45 minutes to an hour after getting wet or sweating. One ounce, or the equivalent of a shot glass full of sunscreen, is adequate to protect the skin not covered by a bathing suit. I also recommended a lip balm with a sunscreen as well. Sun protective clothing can be used in lieu of sunscreen but must be worn the entire time you are exposed to the sun's rays.  Patient Handout: Wound Care for Skin Biopsy Site  Taking Care of Your Skin Biopsy Site  Proper care of the biopsy site is essential for promoting healing and minimizing scarring. This handout provides instructions on how to care for your biopsy site to ensure optimal recovery.  1. Cleaning the Wound:  Clean the biopsy site daily with gentle soap and water. Gently pat the area dry with a clean, soft towel. Avoid harsh scrubbing or rubbing the area, as this can irritate the skin and delay healing.  2. Applying Aquaphor and Bandage:  After cleaning the wound, apply a thin layer of Aquaphor ointment to the biopsy site. Cover the area with a sterile bandage to protect it from dirt, bacteria, and friction. Change the bandage daily or as needed if it becomes soiled or wet.  3. Continued Care for One Week:  Repeat the cleaning, Aquaphor application, and bandaging process daily for one week following the biopsy procedure. Keeping the wound clean and moist during this initial healing period will help  prevent infection and promote optimal healing.  4. Massaging Aquaphor into the Area:  ---After one week, discontinue the use of bandages but continue to apply Aquaphor to the biopsy site. ----Gently massage the Aquaphor into the area using circular motions. ---Massaging the skin helps to promote circulation and prevent the formation of scar tissue.   Additional Tips:  Avoid exposing the biopsy site to direct sunlight during the healing process, as this can cause hyperpigmentation or worsen scarring. If you experience any signs of infection, such as increased redness, swelling, warmth, or drainage from the wound, contact your healthcare provider immediately. Follow any additional instructions provided by your healthcare provider for caring for the biopsy site and managing any discomfort. Conclusion:  Taking proper care of your skin biopsy site is crucial for ensuring optimal healing and minimizing scarring. By following these instructions for cleaning, applying Aquaphor, and massaging the area, you can promote a smooth and successful recovery. If you have any questions or concerns about caring for your biopsy site, don't hesitate to contact your healthcare  provider for guidance.

## 2024-01-13 NOTE — Progress Notes (Signed)
   New Patient Visit   Subjective  Laurie Decker is a 78 y.o. female who presents for the following: spot growing on chest. No hx or family hx of skin cancer.  The patient has spots, moles and lesions to be evaluated, some may be new or changing.  The following portions of the chart were reviewed this encounter and updated as appropriate: medications, allergies, medical history  Review of Systems:  No other skin or systemic complaints except as noted in HPI or Assessment and Plan.  Objective  Well appearing patient in no apparent distress; mood and affect are within normal limits.  A focused examination was performed of the following areas: Chest  Relevant exam findings are noted in the Assessment and Plan.    Assessment & Plan   NEOPLASM OF UNCERTAIN BEHAVIOR OF SKIN Chest - Medial (Center) Skin / nail biopsy Type of biopsy: tangential   Informed consent: discussed and consent obtained   Timeout: patient name, date of birth, surgical site, and procedure verified   Procedure prep:  Patient was prepped and draped in usual sterile fashion Prep type:  Isopropyl alcohol Anesthesia: the lesion was anesthetized in a standard fashion   Anesthetic:  1% lidocaine w/ epinephrine 1-100,000 buffered w/ 8.4% NaHCO3 Instrument used: DermaBlade   Outcome: patient tolerated procedure well   Post-procedure details: sterile dressing applied and wound care instructions given   Dressing type: bandage and petrolatum   Specimen 1 - Surgical pathology Differential Diagnosis: r/o isk vs AK vs nevus vs other  Check Margins: No  Rash Unspecified on Bilateral Forearms The patient reports experiencing a periodic burning rash on both arms that comes and goes. The rash has been previously biopsied by other dermatologists, but minimal diagnostic information was provided regarding the results of those biopsies. Given the recurring nature of the rash and lack of clear diagnostic details, I recommend  that the patient follow up with our office when the rash is present so we can perform a same-day work-up for further evaluation and possible biopsy, if necessary. This will allow for accurate diagnosis and appropriate management of the condition. Patient shared photos at time of visit.  Return if symptoms worsen or fail to improve.  I, Manual Meier, Surg Tech III, am acting as scribe for Gwenith Daily, MD.   Documentation: I have reviewed the above documentation for accuracy and completeness, and I agree with the above.  Gwenith Daily, MD

## 2024-01-15 LAB — SURGICAL PATHOLOGY

## 2024-01-16 ENCOUNTER — Ambulatory Visit: Payer: Medicare Other | Admitting: Family

## 2024-01-29 DIAGNOSIS — H353221 Exudative age-related macular degeneration, left eye, with active choroidal neovascularization: Secondary | ICD-10-CM | POA: Diagnosis not present

## 2024-01-29 DIAGNOSIS — H2513 Age-related nuclear cataract, bilateral: Secondary | ICD-10-CM | POA: Diagnosis not present

## 2024-01-29 DIAGNOSIS — H43813 Vitreous degeneration, bilateral: Secondary | ICD-10-CM | POA: Diagnosis not present

## 2024-01-29 DIAGNOSIS — H353112 Nonexudative age-related macular degeneration, right eye, intermediate dry stage: Secondary | ICD-10-CM | POA: Diagnosis not present

## 2024-03-11 DIAGNOSIS — H43813 Vitreous degeneration, bilateral: Secondary | ICD-10-CM | POA: Diagnosis not present

## 2024-03-11 DIAGNOSIS — H35033 Hypertensive retinopathy, bilateral: Secondary | ICD-10-CM | POA: Diagnosis not present

## 2024-03-11 DIAGNOSIS — H353221 Exudative age-related macular degeneration, left eye, with active choroidal neovascularization: Secondary | ICD-10-CM | POA: Diagnosis not present

## 2024-03-11 DIAGNOSIS — H2513 Age-related nuclear cataract, bilateral: Secondary | ICD-10-CM | POA: Diagnosis not present

## 2024-03-11 DIAGNOSIS — H353112 Nonexudative age-related macular degeneration, right eye, intermediate dry stage: Secondary | ICD-10-CM | POA: Diagnosis not present

## 2024-04-01 ENCOUNTER — Other Ambulatory Visit: Payer: Self-pay | Admitting: Family

## 2024-04-01 DIAGNOSIS — Z1231 Encounter for screening mammogram for malignant neoplasm of breast: Secondary | ICD-10-CM

## 2024-04-30 DIAGNOSIS — H35033 Hypertensive retinopathy, bilateral: Secondary | ICD-10-CM | POA: Diagnosis not present

## 2024-04-30 DIAGNOSIS — H353221 Exudative age-related macular degeneration, left eye, with active choroidal neovascularization: Secondary | ICD-10-CM | POA: Diagnosis not present

## 2024-04-30 DIAGNOSIS — H43813 Vitreous degeneration, bilateral: Secondary | ICD-10-CM | POA: Diagnosis not present

## 2024-04-30 DIAGNOSIS — H2513 Age-related nuclear cataract, bilateral: Secondary | ICD-10-CM | POA: Diagnosis not present

## 2024-04-30 DIAGNOSIS — H353132 Nonexudative age-related macular degeneration, bilateral, intermediate dry stage: Secondary | ICD-10-CM | POA: Diagnosis not present

## 2024-05-03 ENCOUNTER — Ambulatory Visit
Admission: RE | Admit: 2024-05-03 | Discharge: 2024-05-03 | Disposition: A | Discharge status: Home / Self Care | Source: Ambulatory Visit | Attending: Family | Admitting: Family

## 2024-05-03 DIAGNOSIS — Z1231 Encounter for screening mammogram for malignant neoplasm of breast: Secondary | ICD-10-CM | POA: Diagnosis not present

## 2024-05-19 ENCOUNTER — Other Ambulatory Visit: Payer: Self-pay | Admitting: Family

## 2024-05-19 DIAGNOSIS — I10 Essential (primary) hypertension: Secondary | ICD-10-CM

## 2024-06-08 ENCOUNTER — Telehealth: Payer: Self-pay

## 2024-06-08 NOTE — Telephone Encounter (Signed)
 Copied from CRM #[id number]. Topic: Clinical - Request for Lab/Test Order >> Jun 08, 2024 12:14 PM Suzette B wrote: Reason for CRM: Patient is wanting to know if she is needing labs done a few days before her AWV, please call patient at [phone] she states the labs are done normally a few days before so that it's discussed at the visit. Please Advise

## 2024-06-08 NOTE — Telephone Encounter (Signed)
 No labs prior to Annual Wellness visit.will need to schedule  regular routine visit with labs during or before visit.

## 2024-06-08 NOTE — Telephone Encounter (Signed)
 Patient agree and had no questions at this time

## 2024-06-24 DIAGNOSIS — H35033 Hypertensive retinopathy, bilateral: Secondary | ICD-10-CM | POA: Diagnosis not present

## 2024-06-24 DIAGNOSIS — H2513 Age-related nuclear cataract, bilateral: Secondary | ICD-10-CM | POA: Diagnosis not present

## 2024-06-24 DIAGNOSIS — H43813 Vitreous degeneration, bilateral: Secondary | ICD-10-CM | POA: Diagnosis not present

## 2024-06-24 DIAGNOSIS — H353221 Exudative age-related macular degeneration, left eye, with active choroidal neovascularization: Secondary | ICD-10-CM | POA: Diagnosis not present

## 2024-06-24 DIAGNOSIS — H353132 Nonexudative age-related macular degeneration, bilateral, intermediate dry stage: Secondary | ICD-10-CM | POA: Diagnosis not present

## 2024-07-16 ENCOUNTER — Ambulatory Visit (INDEPENDENT_AMBULATORY_CARE_PROVIDER_SITE_OTHER): Payer: Medicare Other | Admitting: Family

## 2024-07-16 ENCOUNTER — Encounter: Payer: Self-pay | Admitting: Family

## 2024-07-16 VITALS — BP 128/82 | HR 88 | Temp 97.6°F | Ht 63.0 in | Wt 147.0 lb

## 2024-07-16 DIAGNOSIS — Z0000 Encounter for general adult medical examination without abnormal findings: Secondary | ICD-10-CM

## 2024-07-16 DIAGNOSIS — Z2821 Immunization not carried out because of patient refusal: Secondary | ICD-10-CM

## 2024-07-16 NOTE — Progress Notes (Signed)
 Subjective:   Laurie Decker is a 78 y.o. female who presents for Medicare Annual (Subsequent) preventive examination.  Visit Complete: In person  Patient Medicare AWV questionnaire was completed by the patient on 07/16/2024; I have confirmed that all information answered by patient is correct and no changes since this date.  Cardiac Risk Factors include: advanced age (>28men, >85 women);hypertension;smoking/ tobacco exposure     Objective:    Today's Vitals   07/16/24 0902  BP: 128/82  Pulse: 88  Temp: 97.6 F (36.4 C)  SpO2: 98%  Weight: 147 lb (66.7 kg)  Height: 5' 3 (1.6 m)   Body mass index is 26.04 kg/m.     12/31/2023    1:51 PM 10/28/2023    1:49 PM 07/17/2023    8:58 AM 03/05/2023   12:50 PM 07/03/2022    9:04 AM 03/19/2022    9:44 AM 12/28/2021    4:51 PM  Advanced Directives  Does Patient Have a Medical Advance Directive? Yes Yes Yes No Yes Yes Yes  Type of Estate agent of [place];Living will;Out of facility DNR (pink MOST or yellow form) Healthcare Power of [place];Living will Healthcare Power of [place];Living will;Out of facility DNR (pink MOST or yellow form)  Healthcare Power of [place];Living will;Out of facility DNR (pink MOST or yellow form) Healthcare Power of [place];Living will;Out of facility DNR (pink MOST or yellow form) Healthcare Power of [place];Living will;Out of facility DNR (pink MOST or yellow form)  Does patient want to make changes to medical advance directive? No - Patient declined No - Patient declined No - Patient declined  No - Patient declined No - Patient declined No - Patient declined  Copy of Healthcare Power of Attorney in Chart? No - copy requested No - copy requested No - copy requested  No - copy requested No - copy requested No - copy requested  Would patient like information on creating a medical advance directive?    No - Patient declined       Current Medications (verified) Outpatient Encounter  Medications as of 07/16/2024  Medication Sig   Calcium  Citrate-Vitamin D (CALCIUM  CITRATE + D3) 200-250 MG-UNIT TABS Take 1 tablet by mouth daily.   Flaxseed, Linseed, (FLAXSEED OIL) 1000 MG CAPS Take by mouth daily.   gentamicin  (GARAMYCIN ) 0.3 % ophthalmic solution Place 2 drops into both eyes every 4 (four) hours.   hydrochlorothiazide  (HYDRODIURIL ) 12.5 MG tablet TAKE 1 TABLET BY MOUTH DAILY   lisinopril  (ZESTRIL ) 20 MG tablet Take 1 tablet (20 mg total) by mouth daily.   Lysine HCl 1000 MG TABS Take by mouth daily.   Magnesium 400 MG CAPS Take 1 capsule by mouth 2 (two) times a week.   Multiple Vitamins-Minerals (CENTRUM ADULTS PO) Take by mouth daily.   naproxen sodium (ALEVE) 220 MG tablet Take 220 mg by mouth as needed.   POTASSIUM PO Take by mouth.   valACYclovir  (VALTREX ) 1000 MG tablet Take 2 tablets (2,000 mg total) by mouth as needed (for cold sore).   carbamide peroxide (DEBROX) 6.5 % OTIC solution Place 5 drops into the left ear 2 (two) times daily. (Patient not taking: Reported on 12/31/2023)   No facility-administered encounter medications on file as of 07/16/2024.    Allergies (verified) Latex   History: Past Medical History:  Diagnosis Date   High blood pressure    Hyperlipidemia    Per previous Cardiology Record    Impaired fasting glucose    Per previous Cardiology records  Macrocytosis 04/02/2016   Per previous Cardiology records    Osteopenia 04/02/2016   Per previous Cardiology records    Past Surgical History:  Procedure Laterality Date   APPENDECTOMY     APPENDECTOMY  1977   BREAST BIOPSY Bilateral    BUNIONECTOMY  2000   COLONOSCOPY  10/07/2012   Per previous Cardiology records    TONSILLECTOMY AND ADENOIDECTOMY  10/08/1955   Per previous Cardiology records    Family History  Problem Relation Age of Onset   Heart disease Mother    Alcoholism Father    Colon cancer Father        malignant tumor of colon    Social History   Socioeconomic  History   Marital status: Married    Spouse name: Not on file   Number of children: 2   Years of education: 16   Highest education level: Not on file  Occupational History   Occupation: retired  Tobacco Use   Smoking status: Former    Current packs/day: 3.00    Types: Cigarettes   Smokeless tobacco: Never   Tobacco comments:    Quit in 20s  Vaping Use   Vaping status: Never Used  Substance and Sexual Activity   Alcohol use: Yes    Alcohol/week: 2.0 standard drinks of alcohol    Types: 2 Shots of liquor per week    Comment: 2 vodka shots  weekly   Drug use: No   Sexual activity: Not on file  Other Topics Concern   Not on file  Social History Narrative   Married, if yes what year: 2014   Do you live in a house, apartment, assisted living, condo, trailer, ect: House, 1 stories, 2 persons; Pets: 1 dog    Diet: N/A   Caffeine: 1 cup of coffee daily      Current/Past profession: Nursing School       Exercise: Yes, daily   rigth handed      Living Will:  Yes; DNR: Yes      POA/HPOA: Yes .   Extended Emergency Contact Information   Primary Emergency Contact: Ritchie,Rob; Relation: Son   Mobile Phone: [phone]      Functional Status:   Do you have difficulty bathing or dressing yourself? No   Do you have difficulty preparing food or eating? No   Do you have difficulty managing your medications? No   Do you have difficulty managing your finances? No   Do you have difficulty affording your medications? No   Social Drivers of Corporate investment banker Strain: Low Risk  (01/16/2018)   Overall Financial Resource Strain (CARDIA)    Difficulty of Paying Living Expenses: Not hard at all  Food Insecurity: No Food Insecurity (01/16/2018)   Hunger Vital Sign    Worried About Running Out of Food in the Last Year: Never true    Ran Out of Food in the Last Year: Never true  Transportation Needs: No Transportation Needs (01/16/2018)   PRAPARE - Scientist, research (physical sciences) (Medical): No    Lack of Transportation (Non-Medical): No  Physical Activity: Inactive (01/16/2018)   Exercise Vital Sign    Days of Exercise per Week: 0 days    Minutes of Exercise per Session: 0 min  Stress: No Stress Concern Present (01/16/2018)   [employer] of Occupational Health - Occupational Stress Questionnaire    Feeling of Stress : Only a little  Social Connections: Moderately Integrated (01/16/2018)  Social Advertising account executive    Frequency of Communication with Friends and Family: Twice a week    Frequency of Social Gatherings with Friends and Family: Twice a week    Attends Religious Services: Never    Database administrator or Organizations: Yes    Attends Engineer, structural: More than 4 times per year    Marital Status: Married    Tobacco Counseling Counseling given: Not Answered Tobacco comments: Quit in 20s   Clinical Intake:  Pre-visit preparation completed: No  Pain : No/denies pain     BMI - recorded: 26.04 Nutritional Status: BMI 25 -29 Overweight Nutritional Risks: None Diabetes: No  How often do you need to have someone help you when you read instructions, pamphlets, or other written materials from your doctor or pharmacy?: 1 - Never What is the last grade level you completed in school?: College  Interpreter Needed?: No   Activities of Daily Living    07/16/2024    9:14 AM  In your present state of health, do you have any difficulty performing the following activities:  Hearing? 0  Vision? 0  Difficulty concentrating or making decisions? 0  Walking or climbing stairs? 0  Dressing or bathing? 0  Doing errands, shopping? 0  Preparing Food and eating ? N  Using the Toilet? N  In the past six months, have you accidently leaked urine? N  Do you have problems with loss of bowel control? N  Managing your Medications? N  Managing your Finances? N  Housekeeping or managing your Housekeeping? N     Patient Care Team: Jazion Atteberry C, NP as PCP - General (Family Medicine) Anner Alm ORN, MD as PCP - Cardiology (Cardiology) Patel, Donika K, DO as Consulting Physician (Neurology)  Indicate any recent Medical Services you may have received from other than [facility] providers in the past year (date may be approximate).     Assessment:   This is a routine wellness examination for Laurie Decker.  Hearing/Vision screen Hearing Screening - Comments:: Patient stated everything good  Vision Screening - Comments:: Injected in left eye Right eye is dry    Goals Addressed             This Visit's Progress    Patient Stated   Not on track    Patient will start doing water aerobics     Weight (lb) < 200 lb (90.7 kg)   147 lb (66.7 kg)    Loss weight < 140 lbs        Depression Screen    07/16/2024    8:56 AM 12/31/2023    1:51 PM 12/31/2023    1:50 PM 10/28/2023    1:48 PM 07/14/2023    2:12 PM 06/19/2022    1:35 PM 06/15/2021   11:16 AM  PHQ 2/9 Scores  PHQ - 2 Score 0 0 0 0 0 0 0  PHQ- 9 Score  0         Fall Risk    07/16/2024    8:56 AM 12/31/2023    1:50 PM 10/28/2023    1:48 PM 07/17/2023    8:58 AM 07/14/2023    2:12 PM  Fall Risk   Falls in the past year? 0 0 0 0 0  Number falls in past yr: 0 0 0 0 0  Injury with Fall? 0 0 0 0 0  Risk for fall due to : No Fall Risks No Fall Risks  No Fall  Risks   Follow up Falls evaluation completed Falls evaluation completed  Falls evaluation completed;Education provided;Falls prevention discussed     MEDICARE RISK AT HOME: Medicare Risk at Home Any stairs in or around the home?: Yes If so, are there any without handrails?: No Home free of loose throw rugs in walkways, pet beds, electrical cords, etc?: Yes Adequate lighting in your home to reduce risk of falls?: Yes Life alert?: No Use of a cane, walker or w/c?: No Grab bars in the bathroom?: Yes Shower chair or bench in shower?: Yes Elevated toilet seat or a handicapped  toilet?: Yes  TIMED UP AND GO:  Was the test performed?  Yes  Length of time to ambulate 10 feet: 4 sec Gait steady and fast without use of assistive device    Cognitive Function:    01/16/2018   10:43 AM  MMSE - Mini Mental State Exam  Orientation to time 5  Orientation to Place 5  Registration 3  Attention/ Calculation 5  Recall 2  Language- name 2 objects 2  Language- repeat 1  Language- follow 3 step command 3  Language- read & follow direction 1  Write a sentence 1  Copy design 1  Total score 29        07/16/2024    9:00 AM 06/19/2022    1:36 PM 06/15/2021   11:17 AM 06/09/2020    8:59 AM  6CIT Screen  What Year? 0 points 0 points 0 points 0 points  What month? 0 points 0 points 0 points 0 points  What time? 0 points 0 points 0 points 0 points  Count back from 20 0 points 0 points 0 points 0 points  Months in reverse 0 points 0 points 0 points 0 points  Repeat phrase 0 points 0 points 0 points 0 points  Total Score 0 points 0 points 0 points 0 points    Immunizations Immunization History  Administered Date(s) Administered   Hepatitis A 04/26/2004, 12/14/2004   Hepatitis B, ADULT 10/07/2005   IPV 07/15/2008   Td 07/21/1976, 02/03/2004   Tdap 07/15/2008   Tetanus 07/22/2018   Yellow Fever 02/03/2004    TDAP status: Due, Education has been provided regarding the importance of this vaccine. Advised may receive this vaccine at local pharmacy or Health Dept. Aware to provide a copy of the vaccination record if obtained from local pharmacy or Health Dept. Verbalized acceptance and understanding.  Flu Vaccine status: Declined, Education has been provided regarding the importance of this vaccine but patient still declined. Advised may receive this vaccine at local pharmacy or Health Dept. Aware to provide a copy of the vaccination record if obtained from local pharmacy or Health Dept. Verbalized acceptance and understanding.  Pneumococcal vaccine status: Declined,   Education has been provided regarding the importance of this vaccine but patient still declined. Advised may receive this vaccine at local pharmacy or Health Dept. Aware to provide a copy of the vaccination record if obtained from local pharmacy or Health Dept. Verbalized acceptance and understanding.   Covid-19 vaccine status: Declined, Education has been provided regarding the importance of this vaccine but patient still declined. Advised may receive this vaccine at local pharmacy or Health Dept.or vaccine clinic. Aware to provide a copy of the vaccination record if obtained from local pharmacy or Health Dept. Verbalized acceptance and understanding.  Qualifies for Shingles Vaccine? Yes   Zostavax completed No   Shingrix Completed?: No.    Education has been provided regarding the  importance of this vaccine. Patient has been advised to call insurance company to determine out of pocket expense if they have not yet received this vaccine. Advised may also receive vaccine at local pharmacy or Health Dept. Verbalized acceptance and understanding.  Screening Tests Health Maintenance  Topic Date Due   Medicare Annual Wellness (AWV)  07/16/2025   DTaP/Tdap/Td (5 - Td or Tdap) 07/22/2028   DEXA SCAN  Completed   Hepatitis C Screening  Completed   Meningococcal B Vaccine  Aged Out   Pneumococcal Vaccine: 50+ Years  Discontinued   Influenza Vaccine  Discontinued   Hepatitis B Vaccines 19-59 Average Risk  Discontinued   Mammogram  Discontinued   Colonoscopy  Discontinued   COVID-19 Vaccine  Discontinued   Zoster Vaccines- Shingrix  Discontinued    Health Maintenance  There are no preventive care reminders to display for this patient.   Colorectal cancer screening: No longer required.   Mammogram status: Completed 06/2024. Repeat every year  Bone Density status: Completed 06/20/2021. Results reflect: Bone density results: OSTEOPENIA. Repeat every 5 years.  Lung Cancer Screening: (Low Dose CT  Chest recommended if Age 40-80 years, 20 pack-year currently smoking OR have quit w/in 15years.) does not qualify.   Lung Cancer Screening Referral: N/A   Additional Screening:  Hepatitis C Screening: does qualify; Completed Yes   Vision Screening: Recommended annual ophthalmology exams for early detection of glaucoma and other disorders of the eye. Is the patient up to date with their annual eye exam?  Yes  Who is the provider or what is the name of the office in which the patient attends annual eye exams? [facility]  If pt is not established with a provider, would they like to be referred to a provider to establish care? No .   Dental Screening: Recommended annual dental exams for proper oral hygiene  Diabetic Foot Exam: Diabetic Foot Exam: Completed N/A   Community Resource Referral / Chronic Care Management: CRR required this visit?  No   CCM required this visit?  No     Plan:     I have personally reviewed and noted the following in the patient's chart:   Medical and social history Use of alcohol, tobacco or illicit drugs  Current medications and supplements including opioid prescriptions. Patient is not currently taking opioid prescriptions. Functional ability and status Nutritional status Physical activity Advanced directives List of other physicians Hospitalizations, surgeries, and ER visits in previous 12 months Vitals Screenings to include cognitive, depression, and falls Referrals and appointments  In addition, I have reviewed and discussed with patient certain preventive protocols, quality metrics, and best practice recommendations. A written personalized care plan for preventive services as well as general preventive health recommendations were provided to patient.     Roxan JAYSON Plough, NP   07/16/2024   After Visit Summary: (In Person-Printed) AVS printed and given to the patient  Nurse Notes: declined all vaccines

## 2024-07-16 NOTE — Patient Instructions (Signed)
 [name] , Thank you for taking time to come for your Medicare Wellness Visit. I appreciate your ongoing commitment to your health goals. Please review the following plan we discussed and let me know if I can assist you in the future.   Screening recommendations/referrals: Colonoscopy N/A Mammogram : Up date  Bone Density : Up date  Recommended yearly ophthalmology/optometry visit for glaucoma screening and checkup Recommended yearly dental visit for hygiene and checkup  Vaccinations: Influenza vaccine- due annually in September/October Pneumococcal vaccine : Declined  Tdap vaccine Declined  Shingles vaccine : Declined     Advanced directives: Yes   Conditions/risks identified: advanced age (>39men, >63 women);hypertension;smoking/ tobacco exposure  Next appointment: 1 year    Preventive Care 17 Years and Older, Female Preventive care refers to lifestyle choices and visits with your health care provider that can promote health and wellness. What does preventive care include? A yearly physical exam. This is also called an annual well check. Dental exams once or twice a year. Routine eye exams. Ask your health care provider how often you should have your eyes checked. Personal lifestyle choices, including: Daily care of your teeth and gums. Regular physical activity. Eating a healthy diet. Avoiding tobacco and drug use. Limiting alcohol use. Practicing safe sex. Taking low-dose aspirin every day. Taking vitamin and mineral supplements as recommended by your health care provider. What happens during an annual well check? The services and screenings done by your health care provider during your annual well check will depend on your age, overall health, lifestyle risk factors, and family history of disease. Counseling  Your health care provider may ask you questions about your: Alcohol use. Tobacco use. Drug use. Emotional well-being. Home and relationship  well-being. Sexual activity. Eating habits. History of falls. Memory and ability to understand (cognition). Work and work Astronomer. Reproductive health. Screening  You may have the following tests or measurements: Height, weight, and BMI. Blood pressure. Lipid and cholesterol levels. These may be checked every 5 years, or more frequently if you are over 25 years old. Skin check. Lung cancer screening. You may have this screening every year starting at age 67 if you have a 30-pack-year history of smoking and currently smoke or have quit within the past 15 years. Fecal occult blood test (FOBT) of the stool. You may have this test every year starting at age 66. Flexible sigmoidoscopy or colonoscopy. You may have a sigmoidoscopy every 5 years or a colonoscopy every 10 years starting at age 34. Hepatitis C blood test. Hepatitis B blood test. Sexually transmitted disease (STD) testing. Diabetes screening. This is done by checking your blood sugar (glucose) after you have not eaten for a while (fasting). You may have this done every 1-3 years. Bone density scan. This is done to screen for osteoporosis. You may have this done starting at age 59. Mammogram. This may be done every 1-2 years. Talk to your health care provider about how often you should have regular mammograms. Talk with your health care provider about your test results, treatment options, and if necessary, the need for more tests. Vaccines  Your health care provider may recommend certain vaccines, such as: Influenza vaccine. This is recommended every year. Tetanus, diphtheria, and acellular pertussis (Tdap, Td) vaccine. You may need a Td booster every 10 years. Zoster vaccine. You may need this after age 55. Pneumococcal 13-valent conjugate (PCV13) vaccine. One dose is recommended after age 36. Pneumococcal polysaccharide (PPSV23) vaccine. One dose is recommended after age 56.  Talk to your health care provider about which  screenings and vaccines you need and how often you need them. This information is not intended to replace advice given to you by your health care provider. Make sure you discuss any questions you have with your health care provider. Document Released: 10/20/2015 Document Revised: 06/12/2016 Document Reviewed: 07/25/2015 Elsevier Interactive Patient Education  2017 [employer].  Fall Prevention in the Home Falls can cause injuries. They can happen to people of all ages. There are many things you can do to make your home safe and to help prevent falls. What can I do on the outside of my home? Regularly fix the edges of walkways and driveways and fix any cracks. Remove anything that might make you trip as you walk through a door, such as a raised step or threshold. Trim any bushes or trees on the path to your home. Use bright outdoor lighting. Clear any walking paths of anything that might make someone trip, such as rocks or tools. Regularly check to see if handrails are loose or broken. Make sure that both sides of any steps have handrails. Any raised decks and porches should have guardrails on the edges. Have any leaves, snow, or ice cleared regularly. Use sand or salt on walking paths during winter. Clean up any spills in your garage right away. This includes oil or grease spills. What can I do in the bathroom? Use night lights. Install grab bars by the toilet and in the tub and shower. Do not use towel bars as grab bars. Use non-skid mats or decals in the tub or shower. If you need to sit down in the shower, use a plastic, non-slip stool. Keep the floor dry. Clean up any water that spills on the floor as soon as it happens. Remove soap buildup in the tub or shower regularly. Attach bath mats securely with double-sided non-slip rug tape. Do not have throw rugs and other things on the floor that can make you trip. What can I do in the bedroom? Use night lights. Make sure that you have a  light by your bed that is easy to reach. Do not use any sheets or blankets that are too big for your bed. They should not hang down onto the floor. Have a firm chair that has side arms. You can use this for support while you get dressed. Do not have throw rugs and other things on the floor that can make you trip. What can I do in the kitchen? Clean up any spills right away. Avoid walking on wet floors. Keep items that you use a lot in easy-to-reach places. If you need to reach something above you, use a strong step stool that has a grab bar. Keep electrical cords out of the way. Do not use floor polish or wax that makes floors slippery. If you must use wax, use non-skid floor wax. Do not have throw rugs and other things on the floor that can make you trip. What can I do with my stairs? Do not leave any items on the stairs. Make sure that there are handrails on both sides of the stairs and use them. Fix handrails that are broken or loose. Make sure that handrails are as long as the stairways. Check any carpeting to make sure that it is firmly attached to the stairs. Fix any carpet that is loose or worn. Avoid having throw rugs at the top or bottom of the stairs. If you do have throw  rugs, attach them to the floor with carpet tape. Make sure that you have a light switch at the top of the stairs and the bottom of the stairs. If you do not have them, ask someone to add them for you. What else can I do to help prevent falls? Wear shoes that: Do not have high heels. Have rubber bottoms. Are comfortable and fit you well. Are closed at the toe. Do not wear sandals. If you use a stepladder: Make sure that it is fully opened. Do not climb a closed stepladder. Make sure that both sides of the stepladder are locked into place. Ask someone to hold it for you, if possible. Clearly mark and make sure that you can see: Any grab bars or handrails. First and last steps. Where the edge of each step  is. Use tools that help you move around (mobility aids) if they are needed. These include: Canes. Walkers. Scooters. Crutches. Turn on the lights when you go into a dark area. Replace any light bulbs as soon as they burn out. Set up your furniture so you have a clear path. Avoid moving your furniture around. If any of your floors are uneven, fix them. If there are any pets around you, be aware of where they are. Review your medicines with your doctor. Some medicines can make you feel dizzy. This can increase your chance of falling. Ask your doctor what other things that you can do to help prevent falls. This information is not intended to replace advice given to you by your health care provider. Make sure you discuss any questions you have with your health care provider. Document Released: 07/20/2009 Document Revised: 02/29/2016 Document Reviewed: 10/28/2014 Elsevier Interactive Patient Education  2017 [employer].

## 2024-07-26 ENCOUNTER — Other Ambulatory Visit

## 2024-07-26 DIAGNOSIS — E782 Mixed hyperlipidemia: Secondary | ICD-10-CM

## 2024-07-26 DIAGNOSIS — I10 Essential (primary) hypertension: Secondary | ICD-10-CM

## 2024-07-26 LAB — COMPREHENSIVE METABOLIC PANEL WITH GFR
AG Ratio: 2 (calc) (ref 1.0–2.5)
ALT: 18 U/L (ref 6–29)
AST: 27 U/L (ref 10–35)
Albumin: 4.5 g/dL (ref 3.6–5.1)
Alkaline phosphatase (APISO): 44 U/L (ref 37–153)
BUN: 15 mg/dL (ref 7–25)
CO2: 26 mmol/L (ref 20–32)
Calcium: 10.1 mg/dL (ref 8.6–10.4)
Chloride: 94 mmol/L — ABNORMAL LOW (ref 98–110)
Creat: 0.69 mg/dL (ref 0.60–1.00)
Globulin: 2.2 g/dL (ref 1.9–3.7)
Glucose, Bld: 79 mg/dL (ref 65–99)
Potassium: 4.2 mmol/L (ref 3.5–5.3)
Sodium: 133 mmol/L — ABNORMAL LOW (ref 135–146)
Total Bilirubin: 0.7 mg/dL (ref 0.2–1.2)
Total Protein: 6.7 g/dL (ref 6.1–8.1)
eGFR: 89 mL/min/1.73m2 (ref >=60)

## 2024-07-26 LAB — CBC WITH DIFFERENTIAL/PLATELET
Absolute Lymphocytes: 1219 {cells}/uL (ref 850–3900)
Absolute Monocytes: 627 {cells}/uL (ref 200–950)
Basophils Absolute: 41 {cells}/uL (ref 0–200)
Basophils Relative: 0.8 %
Eosinophils Absolute: 204 {cells}/uL (ref 15–500)
Eosinophils Relative: 4 %
HCT: 39.8 % (ref 35.0–45.0)
Hemoglobin: 13.3 g/dL (ref 11.7–15.5)
MCH: 34.1 pg — ABNORMAL HIGH (ref 27.0–33.0)
MCHC: 33.4 g/dL (ref 32.0–36.0)
MCV: 102.1 fL — ABNORMAL HIGH (ref 80.0–100.0)
MPV: 10.6 fL (ref 7.5–12.5)
Monocytes Relative: 12.3 %
Neutro Abs: 3009 {cells}/uL (ref 1500–7800)
Neutrophils Relative %: 59 %
Platelets: 182 Thousand/uL (ref 140–400)
RBC: 3.9 Million/uL (ref 3.80–5.10)
RDW: 12.2 % (ref 11.0–15.0)
Total Lymphocyte: 23.9 %
WBC: 5.1 Thousand/uL (ref 3.8–10.8)

## 2024-07-26 LAB — LIPID PANEL
Cholesterol: 213 mg/dL — ABNORMAL HIGH (ref <200)
HDL: 92 mg/dL (ref >=50)
LDL Cholesterol (Calc): 102 mg/dL — ABNORMAL HIGH
Non-HDL Cholesterol (Calc): 121 mg/dL (ref <130)
Total CHOL/HDL Ratio: 2.3 (calc) (ref <5.0)
Triglycerides: 93 mg/dL (ref <150)

## 2024-08-02 ENCOUNTER — Ambulatory Visit: Admitting: Family

## 2024-08-02 ENCOUNTER — Encounter: Payer: Self-pay | Admitting: Family

## 2024-08-02 VITALS — BP 126/78 | HR 71 | Temp 97.9°F | Resp 18 | Ht 63.0 in | Wt 148.2 lb

## 2024-08-02 DIAGNOSIS — E871 Hypo-osmolality and hyponatremia: Secondary | ICD-10-CM | POA: Diagnosis not present

## 2024-08-02 DIAGNOSIS — E782 Mixed hyperlipidemia: Secondary | ICD-10-CM

## 2024-08-02 DIAGNOSIS — I10 Essential (primary) hypertension: Secondary | ICD-10-CM | POA: Diagnosis not present

## 2024-08-02 DIAGNOSIS — I872 Venous insufficiency (chronic) (peripheral): Secondary | ICD-10-CM | POA: Diagnosis not present

## 2024-08-02 DIAGNOSIS — Z8619 Personal history of other infectious and parasitic diseases: Secondary | ICD-10-CM

## 2024-08-05 ENCOUNTER — Ambulatory Visit: Payer: Self-pay | Admitting: Family

## 2024-08-11 DIAGNOSIS — I872 Venous insufficiency (chronic) (peripheral): Secondary | ICD-10-CM | POA: Insufficient documentation

## 2024-08-11 NOTE — Progress Notes (Signed)
 Provider: Roxan Plough FNP-C   Dina Warbington, Roxan BROCKS, NP  Patient Care Team: Norlene Lanes, Roxan BROCKS, NP as PCP - General (Family Medicine) Anner Alm ORN, MD as PCP - Cardiology (Cardiology) Tobie Tonita POUR, DO as Consulting Physician (Neurology)  Extended Emergency Contact Information Primary Emergency Contact: Ritchie,Rob Mobile Phone: [phone] Relation: Son Secondary Emergency Contact: Bardelli,Peggy Mobile Phone: [phone] Relation: Friend  Code Status: Full code Goals of care: Advanced Directive information    08/02/2024    9:15 AM  Advanced Directives  Does Patient Have a Medical Advance Directive? Yes  Type of Estate Agent of [place];Living will;Out of facility DNR (pink MOST or yellow form)  Does patient want to make changes to medical advance directive? No - Patient declined  Copy of Healthcare Power of Attorney in Chart? No - copy requested     Chief Complaint  Patient presents with   Follow-up    3 Week follow up.    Discussed the use of AI scribe software for clinical note transcription with the patient, who gave verbal consent to proceed.  History of Present Illness   Laurie Decker is a 78 year old female who presents for a six-month follow-up visit.  She reports no changes in her health status since her last visit and has been doing well. No pain is present, and her blood pressure has been stable at home. She is no longer using diltiazem and continues to take hydrochlorothiazide  12.5 mg and lisinopril  20 mg for blood pressure management.  She occasionally uses Valtrex  at the onset of symptoms, taking two doses to ease the symptoms. She no longer requires gentamicin  eye ointment, which was previously used for conjunctivitis.  Recent lab work showed a sodium level of 133, slightly below the normal range. She drinks water throughout the day and experiences frequent urination. Her glucose level was 79, and her cholesterol has improved  from 229 to 213 over the past year. She follows a diet high in protein, including fish and turkey, and avoids deep-fried foods.  She engages in regular exercise through a program called 'two ways to move,' which includes cardio, strength, and balance exercises. She cooks her meals at home and consumes a diet rich in protein, including bratwurst, fish, and occasional hamburgers without bread.  No issues with her bowels, attributing regularity to magnesium supplementation. She reports good sleep, although her dog wakes her up early in the morning. She has been to the dentist this year. She mentions a cracked tooth that required extraction instead of a crown.  She continues to perform exercises to maintain flexibility and strength.   Past Medical History:  Diagnosis Date   High blood pressure    Hyperlipidemia    Per previous Cardiology Record    Impaired fasting glucose    Per previous Cardiology records    Macrocytosis 04/02/2016   Per previous Cardiology records    Osteopenia 04/02/2016   Per previous Cardiology records    Past Surgical History:  Procedure Laterality Date   APPENDECTOMY     APPENDECTOMY  1977   BREAST BIOPSY Bilateral    BUNIONECTOMY  2000   COLONOSCOPY  10/07/2012   Per previous Cardiology records    TONSILLECTOMY AND ADENOIDECTOMY  10/08/1955   Per previous Cardiology records     Allergies  Allergen Reactions   Latex     Allergies as of 08/02/2024       Reactions   Latex         Medication  List        Accurate as of August 02, 2024 11:59 PM. If you have any questions, ask your nurse or doctor.          STOP taking these medications    Debrox 6.5 % OTIC solution Generic drug: carbamide peroxide Stopped by: Roxan BROCKS Liddy Deam   gentamicin  0.3 % ophthalmic solution Commonly known as: GARAMYCIN  Stopped by: Camisha Srey C Moussa Wiegand       TAKE these medications    Calcium  Citrate + D3 200-250 MG-UNIT Tabs Generic drug: Calcium  Citrate-Vitamin  D Take 1 tablet by mouth daily.   CENTRUM ADULTS PO Take by mouth daily.   Flaxseed Oil 1000 MG Caps Take by mouth daily.   hydrochlorothiazide  12.5 MG tablet Commonly known as: HYDRODIURIL  TAKE 1 TABLET BY MOUTH DAILY   lisinopril  20 MG tablet Commonly known as: ZESTRIL  Take 1 tablet (20 mg total) by mouth daily.   Lysine HCl 1000 MG Tabs Take by mouth daily.   Magnesium 400 MG Caps Take 1 capsule by mouth 2 (two) times a week.   naproxen sodium 220 MG tablet Commonly known as: ALEVE Take 220 mg by mouth as needed.   POTASSIUM PO Take by mouth.   valACYclovir  1000 MG tablet Commonly known as: Valtrex  Take 2 tablets (2,000 mg total) by mouth as needed (for cold sore).        Review of Systems  Constitutional:  Negative for appetite change, chills, fatigue, fever and unexpected weight change.  HENT:  Negative for congestion, dental problem, ear discharge, ear pain, hearing loss, nosebleeds, postnasal drip, rhinorrhea, sinus pressure, sinus pain, sneezing, sore throat, tinnitus and trouble swallowing.   Eyes:  Negative for pain, discharge, redness, itching and visual disturbance.  Respiratory:  Negative for cough, chest tightness, shortness of breath and wheezing.   Cardiovascular:  Negative for chest pain, palpitations and leg swelling.  Gastrointestinal:  Negative for abdominal distention, abdominal pain, blood in stool, constipation, diarrhea, nausea and vomiting.  Endocrine: Negative for cold intolerance, heat intolerance, polydipsia, polyphagia and polyuria.  Genitourinary:  Negative for difficulty urinating, dysuria, flank pain, frequency and urgency.  Musculoskeletal:  Negative for arthralgias, back pain, gait problem, joint swelling, myalgias, neck pain and neck stiffness.  Skin:  Negative for color change, pallor, rash and wound.  Neurological:  Negative for dizziness, syncope, speech difficulty, weakness, light-headedness, numbness and headaches.   Hematological:  Does not bruise/bleed easily.  Psychiatric/Behavioral:  Negative for agitation, behavioral problems, confusion, hallucinations, self-injury, sleep disturbance and suicidal ideas. The patient is not nervous/anxious.     Immunization History  Administered Date(s) Administered   Hepatitis A 04/26/2004, 12/14/2004   Hepatitis B, ADULT 10/07/2005   IPV 07/15/2008   Td 07/21/1976, 02/03/2004   Tdap 07/15/2008   Tetanus 07/22/2018   Yellow Fever 02/03/2004   Pertinent  Health Maintenance Due  Topic Date Due   DEXA SCAN  Completed   Influenza Vaccine  Discontinued   Mammogram  Discontinued   Colonoscopy  Discontinued      07/14/2023    2:12 PM 07/17/2023    8:58 AM 10/28/2023    1:48 PM 12/31/2023    1:50 PM 07/16/2024    8:56 AM  Fall Risk  Falls in the past year? 0 0 0 0 0  Was there an injury with Fall? 0 0 0 0 0  Fall Risk Category Calculator 0 0 0 0 0  Patient at Risk for Falls Due to  No Fall Risks  No  Fall Risks No Fall Risks  Fall risk Follow up  Falls evaluation completed;Education provided;Falls prevention discussed  Falls evaluation completed Falls evaluation completed   Functional Status Survey:    Vitals:   08/02/24 0916  BP: 126/78  Pulse: 71  Resp: 18  Temp: 97.9 F (36.6 C)  SpO2: 96%  Weight: 148 lb 3.2 oz (67.2 kg)  Height: 5' 3 (1.6 m)   Body mass index is 26.25 kg/m. Physical Exam VITALS: T- 97.9, P- 71, BP- 126/78, SaO2- 96% MEASUREMENTS: Weight- 148. GENERAL: Alert, cooperative, well developed, no acute distress. HEENT: Normocephalic, normal oropharynx, moist mucous membranes, ears normal bilaterally, nose normal, no sinus tenderness. NECK: Neck normal. CHEST: Clear to auscultation bilaterally, no wheezes, rhonchi, or crackles. CARDIOVASCULAR: Normal heart rate and rhythm, S1 and S2 normal without murmurs. ABDOMEN: Soft, non-tender, non-distended, without organomegaly, normal bowel sounds. EXTREMITIES: No cyanosis or edema,  extremities normal. MUSCULOSKELETAL: Knees and hips normal range of motion. NEUROLOGICAL: Cranial nerves grossly intact, moves all extremities without gross motor or sensory deficit. SKIN: Spot on belly unchanged.  No erythema   PSYCHIATRY/BEHAVIORAL: Mood stable    Labs reviewed: Recent Labs    07/26/24 0828  NA 133*  K 4.2  CL 94*  CO2 26  GLUCOSE 79  BUN 15  CREATININE 0.69  CALCIUM  10.1   Recent Labs    07/26/24 0828  AST 27  ALT 18  BILITOT 0.7  PROT 6.7   Recent Labs    07/26/24 0828  WBC 5.1  NEUTROABS 3,009  HGB 13.3  HCT 39.8  MCV 102.1*  PLT 182   Lab Results  Component Value Date   TSH 2.86 07/01/2023   Lab Results  Component Value Date   HGBA1C 5.3 12/24/2021   Lab Results  Component Value Date   CHOL 213 (H) 07/26/2024   HDL 92 07/26/2024   LDLCALC 102 (H) 07/26/2024   TRIG 93 07/26/2024   CHOLHDL 2.3 07/26/2024    Significant Diagnostic Results in last 30 days:  No results found.  Assessment/Plan    Essential hypertension Blood pressure is well-controlled with current medication regimen. No leg swelling reported. - Continue hydrochlorothiazide  12.5 mg daily - Continue lisinopril  20 mg daily  Hyperlipidemia Total cholesterol decreased from 229 to 213 mg/dL, not yet at goal of below 200 mg/dL. HDL is excellent at 90 mg/dL. Triglycerides improved from 166 to 97 mg/dL. LDL decreased from 111 to 897 mg/dL, close to goal of below 100 mg/dL. Dietary changes and exercise positively affect lipid profile. - Continue current dietary and exercise regimen - Recheck lipid panel at next follow-up  Mild hyponatremia Sodium level slightly low at 133 mmol/L, likely due to hydrochlorothiazide  and high water intake. - Advise moderation of water intake and consider occasional electrolyte replacement with Gatorade  Chronic venous insufficiency with spider veins, bilateral legs No current leg swelling or pain. Spider veins present. - Use compression  stockings if leg swelling occurs  Recurrent herpes labialis Recurrent episodes managed with Valtrex  as needed. Two doses at onset effectively alleviate outbreaks. - Continue Valtrex  as needed for herpes labialis outbreaks  Family/ staff Communication: Reviewed plan of care with patient verbalized understanding   Labs/tests ordered:  - CBC with Differential/Platelet - CMP with eGFR([facility]) - TSH - Lipid panel   Next Appointment : Return in about 6 months (around 01/31/2025) for medical mangement of chronic issues., Fasting labs in 6 months prior to visit.   Spent 30 minutes of Face to face and non-face  to face with patient  >50% time spent counseling; reviewing medical record; tests; labs; documentation and developing future plan of care.   Roxan JAYSON Plough, NP

## 2024-08-15 ENCOUNTER — Other Ambulatory Visit: Payer: Self-pay | Admitting: Family

## 2024-08-15 DIAGNOSIS — I10 Essential (primary) hypertension: Secondary | ICD-10-CM

## 2024-08-19 DIAGNOSIS — H43813 Vitreous degeneration, bilateral: Secondary | ICD-10-CM | POA: Diagnosis not present

## 2024-08-19 DIAGNOSIS — H2513 Age-related nuclear cataract, bilateral: Secondary | ICD-10-CM | POA: Diagnosis not present

## 2024-08-19 DIAGNOSIS — H35033 Hypertensive retinopathy, bilateral: Secondary | ICD-10-CM | POA: Diagnosis not present

## 2024-08-19 DIAGNOSIS — H353112 Nonexudative age-related macular degeneration, right eye, intermediate dry stage: Secondary | ICD-10-CM | POA: Diagnosis not present

## 2024-08-19 DIAGNOSIS — H353221 Exudative age-related macular degeneration, left eye, with active choroidal neovascularization: Secondary | ICD-10-CM | POA: Diagnosis not present

## 2025-01-18 ENCOUNTER — Other Ambulatory Visit: Payer: Self-pay

## 2025-01-25 ENCOUNTER — Ambulatory Visit: Payer: Self-pay | Admitting: Family

## 2025-07-18 ENCOUNTER — Ambulatory Visit: Payer: Self-pay | Admitting: Family
